# Patient Record
Sex: Male | Born: 1970 | Race: White | Hispanic: No | Marital: Single | Smoking: Never smoker
Health system: Southern US, Community
[De-identification: ages and names within clinical notes are randomized; demographics above are authoritative.]

## PROBLEM LIST (undated history)

## (undated) DIAGNOSIS — E119 Type 2 diabetes mellitus without complications: Secondary | ICD-10-CM

## (undated) DIAGNOSIS — E1165 Type 2 diabetes mellitus with hyperglycemia: Principal | ICD-10-CM

## (undated) DIAGNOSIS — I2699 Other pulmonary embolism without acute cor pulmonale: Secondary | ICD-10-CM

## (undated) HISTORY — DX: Other pulmonary embolism without acute cor pulmonale: I26.99

## (undated) HISTORY — DX: Type 2 diabetes mellitus without complications: E11.9

## (undated) HISTORY — DX: Type 2 diabetes mellitus with hyperglycemia: E11.65

---

## 2004-10-01 ENCOUNTER — Emergency Department: Payer: Self-pay | Admitting: Unknown Physician Specialty

## 2006-08-06 DIAGNOSIS — IMO0001 Reserved for inherently not codable concepts without codable children: Secondary | ICD-10-CM

## 2006-08-06 HISTORY — DX: Reserved for inherently not codable concepts without codable children: IMO0001

## 2011-06-09 ENCOUNTER — Inpatient Hospital Stay: Payer: Self-pay | Admitting: Psychiatry

## 2011-08-07 HISTORY — PX: COLONOSCOPY WITH ESOPHAGOGASTRODUODENOSCOPY (EGD): SHX5779

## 2012-03-25 ENCOUNTER — Ambulatory Visit: Payer: Self-pay | Admitting: Otolaryngology

## 2012-06-02 ENCOUNTER — Ambulatory Visit: Payer: Self-pay | Admitting: Unknown Physician Specialty

## 2013-04-07 ENCOUNTER — Ambulatory Visit (INDEPENDENT_AMBULATORY_CARE_PROVIDER_SITE_OTHER): Payer: BC Managed Care – PPO | Admitting: Family Medicine

## 2013-04-07 ENCOUNTER — Encounter: Payer: Self-pay | Admitting: Family Medicine

## 2013-04-07 VITALS — BP 110/70 | HR 80 | Temp 98.6°F | Ht 72.0 in | Wt 241.0 lb

## 2013-04-07 DIAGNOSIS — IMO0001 Reserved for inherently not codable concepts without codable children: Secondary | ICD-10-CM

## 2013-04-07 DIAGNOSIS — E1140 Type 2 diabetes mellitus with diabetic neuropathy, unspecified: Secondary | ICD-10-CM | POA: Insufficient documentation

## 2013-04-07 DIAGNOSIS — Z23 Encounter for immunization: Secondary | ICD-10-CM

## 2013-04-07 LAB — MICROALBUMIN / CREATININE URINE RATIO: Microalb Creat Ratio: 0.9 mg/g (ref 0.0–30.0)

## 2013-04-07 LAB — BASIC METABOLIC PANEL
CO2: 27 mEq/L (ref 19–32)
Calcium: 9.2 mg/dL (ref 8.4–10.5)
Chloride: 96 mEq/L (ref 96–112)
Creatinine, Ser: 0.8 mg/dL (ref 0.4–1.5)
Glucose, Bld: 276 mg/dL — ABNORMAL HIGH (ref 70–99)

## 2013-04-07 MED ORDER — METFORMIN HCL 1000 MG PO TABS: 1000.0000 mg | ORAL_TABLET | Freq: Two times a day (BID) | ORAL | Status: DC

## 2013-04-07 NOTE — Assessment & Plan Note (Signed)
Foot exam today. Recommended schedule eye exam. Restart metformin , slow taper to try and minimize GI upset. rtc 3 mo for f/u Blood work today.

## 2013-04-07 NOTE — Progress Notes (Signed)
Subjective:    Patient ID: Paul Keller, male    DOB: [birth year], 42 y.o.   MRN: [id number]  HPI CC: New pt to establish  Prior saw Dr. Lahoma Rocker PCP, wanted a change  DM - off meds for last 6 months.  Prior on metformin 3000mg  daily, 1500mg  bid.  Has not been checking sugars recently.  Last blood work was late 2013.  Has glucose meter at home, unsure brand.  Has not been checking sugars recently.  Off meds for last 6 months.  Last eye exam was mid 2013. Foot exam - none recently.  recently had EGD - Dr. Markham Jordan - scar removed, had colonoscopy for fmhx colon cancer, told normal.  Done 2013.  Caffeine: occasional Lives with GF and step daughter, 3 dogs, 1 cat divorced Occupation: road Games developer  Edu: College Activity: work, occasionally goes to gym Diet: good water, fruits/vegetables daily, avoids fried foods and starches  Preventative: DOT physical in January. Pneumovax 2013 Tetanus unsure Flu - declines  Medications and allergies reviewed and updated in chart.  Past histories reviewed and updated if relevant as below. There are no active problems to display for this patient.  Past Medical History  Diagnosis Date  . Type 2 diabetes mellitus 2008    completed DSME   Past Surgical History  Procedure Laterality Date  . Colonoscopy with esophagogastroduodenoscopy (egd)  2013    normal-found scar tissue and removed (Dr. Markham Jordan)   History  Substance Use Topics  . Smoking status: Never Smoker   . Smokeless tobacco: Never Used  . Alcohol Use: No   Family History  Problem Relation Age of Onset  . Cancer      unsure details - mother adopted, no contact with father  . Diabetes Mother   . Diabetes Father   . Hypertension Mother   . Hypertension Father   . CAD Brother     MI  . Heart failure Mother   . Heart failure Father   . Stroke Neg Hx    Allergies  Allergen Reactions  . Penicillins Rash   No current outpatient prescriptions on file prior to  visit.   No current facility-administered medications on file prior to visit.     Review of Systems  Constitutional: Negative for fever, chills, activity change, appetite change, fatigue and unexpected weight change.  HENT: Negative for hearing loss and neck pain.   Eyes: Negative for visual disturbance.  Respiratory: Negative for cough, chest tightness, shortness of breath and wheezing.   Cardiovascular: Negative for chest pain, palpitations and leg swelling.  Gastrointestinal: Negative for nausea, vomiting, abdominal pain, diarrhea, constipation, blood in stool and abdominal distention.  Genitourinary: Negative for hematuria and difficulty urinating.  Musculoskeletal: Negative for myalgias and arthralgias.  Skin: Negative for rash.  Neurological: Negative for dizziness, seizures, syncope and headaches.  Hematological: Negative for adenopathy. Does not bruise/bleed easily.  Psychiatric/Behavioral: Negative for dysphoric mood. The patient is not nervous/anxious.        Objective:   Physical Exam  Nursing note and vitals reviewed. Constitutional: He is oriented to person, place, and time. He appears well-developed and well-nourished. No distress.  HENT:  Head: Normocephalic and atraumatic.  Right Ear: Hearing, tympanic membrane, external ear and ear canal normal.  Left Ear: Hearing, tympanic membrane, external ear and ear canal normal.  Nose: Nose normal.  Mouth/Throat: Oropharynx is clear and moist. No oropharyngeal exudate.  Eyes: Conjunctivae and EOM are normal. Pupils are equal, round, and reactive to light. No  scleral icterus.  Neck: Normal range of motion. Neck supple. No thyromegaly present.  Cardiovascular: Normal rate, regular rhythm, normal heart sounds and intact distal pulses.   No murmur heard. Pulses:      Radial pulses are 2+ on the right side, and 2+ on the left side.  Pulmonary/Chest: Effort normal and breath sounds normal. No respiratory distress. He has no  wheezes. He has no rales.  Musculoskeletal: Normal range of motion. He exhibits no edema.  Diabetic foot exam: Normal inspection No skin breakdown No calluses  Normal DP/PT pulses Normal sensation to light touch and monofilament Nails normal  Lymphadenopathy:    He has no cervical adenopathy.  Neurological: He is alert and oriented to person, place, and time.  CN grossly intact, station and gait intact  Skin: Skin is warm and dry. No rash noted.  Psychiatric: He has a normal mood and affect. His behavior is normal. Judgment and thought content normal.      Assessment & Plan:  Tdap today.

## 2013-04-07 NOTE — Patient Instructions (Addendum)
Blood work today. Restart metformin.  Take 500mg  nightly for 4 days then increase to 500mg  twice daily for 1-2 week then increase to 1000mg  twice daily. Let me know brand of glucose meter to send in strips. Return in 2-3 months for recheck sugars. Tdap today (tetanus /pertussis)

## 2013-04-07 NOTE — Addendum Note (Signed)
Addended by: Josph Macho A on: 04/07/2013 10:52 AM   Modules accepted: Orders

## 2013-06-03 ENCOUNTER — Telehealth: Payer: Self-pay

## 2013-06-03 NOTE — Telephone Encounter (Signed)
Patient notified and appt scheduled.

## 2013-06-03 NOTE — Telephone Encounter (Signed)
Pt said he slowly tapered metformin and is now taking 1000 mg twice a day. About 4 hours after taking Metformin pt has diarrhea. No N or V. Pt has previously discussed with Dr Sharen Hones about taking Cialis once a day. Pt request written rx for Cialis and if Dr Sharen Hones changes diabetic med pt request written rx because pt is going to use mail order pharmacy due to ins guidelines and pt does not know name of mail order pharmacy yet.pt request cb.

## 2013-06-03 NOTE — Telephone Encounter (Signed)
I would give metformin 1000mg  bid at least 1-2 wks as it can take a while for GI side effects to subside with titration. No records from prior PCP Recommend office visit to discuss sugars as well as cialis/ED.

## 2013-06-05 ENCOUNTER — Ambulatory Visit: Payer: BC Managed Care – PPO | Admitting: Family Medicine

## 2013-06-11 ENCOUNTER — Other Ambulatory Visit: Payer: Self-pay

## 2013-07-07 ENCOUNTER — Ambulatory Visit: Payer: BC Managed Care – PPO | Admitting: Family Medicine

## 2013-07-08 ENCOUNTER — Ambulatory Visit: Payer: BC Managed Care – PPO | Admitting: Family Medicine

## 2014-02-15 ENCOUNTER — Telehealth: Payer: Self-pay | Admitting: *Deleted

## 2014-02-15 NOTE — Telephone Encounter (Signed)
Lm on pts vm requesting a call back to schedule DIABETIC BUNDLE OV-needs LDL and A1C

## 2014-06-30 ENCOUNTER — Other Ambulatory Visit: Payer: Self-pay | Admitting: Family Medicine

## 2014-07-05 NOTE — Telephone Encounter (Signed)
Pt left v/m requesting refill metformin to [facility]; sent to pharmacy on 07/02/14; spoke with [facility] and rx ready for pick up. Left detailed v/m that metformin ready for pick up at [facility] and to keep appt on 07/06/14.

## 2014-07-06 ENCOUNTER — Other Ambulatory Visit: Payer: Self-pay

## 2014-07-06 ENCOUNTER — Encounter: Payer: Self-pay | Admitting: Family Medicine

## 2014-07-06 ENCOUNTER — Ambulatory Visit (INDEPENDENT_AMBULATORY_CARE_PROVIDER_SITE_OTHER): Payer: BC Managed Care – PPO | Admitting: Family Medicine

## 2014-07-06 VITALS — BP 124/76 | HR 104 | Temp 98.2°F | Ht 72.0 in | Wt 246.2 lb

## 2014-07-06 DIAGNOSIS — E1140 Type 2 diabetes mellitus with diabetic neuropathy, unspecified: Secondary | ICD-10-CM

## 2014-07-06 DIAGNOSIS — E1165 Type 2 diabetes mellitus with hyperglycemia: Secondary | ICD-10-CM

## 2014-07-06 DIAGNOSIS — N529 Male erectile dysfunction, unspecified: Secondary | ICD-10-CM

## 2014-07-06 DIAGNOSIS — Z8249 Family history of ischemic heart disease and other diseases of the circulatory system: Secondary | ICD-10-CM | POA: Insufficient documentation

## 2014-07-06 DIAGNOSIS — IMO0002 Reserved for concepts with insufficient information to code with codable children: Secondary | ICD-10-CM

## 2014-07-06 LAB — MICROALBUMIN / CREATININE URINE RATIO
CREATININE, U: 69.3 mg/dL
Microalb Creat Ratio: 1.6 mg/g (ref 0.0–30.0)
Microalb, Ur: 1.1 mg/dL (ref 0.0–1.9)

## 2014-07-06 LAB — HEMOGLOBIN A1C: Hgb A1c MFr Bld: 10.2 % — ABNORMAL HIGH (ref 4.6–6.5)

## 2014-07-06 MED ORDER — ONETOUCH ULTRA SYSTEM W/DEVICE KIT: 1.0000 | PACK | Freq: Once | Status: AC

## 2014-07-06 MED ORDER — METFORMIN HCL ER (MOD) 1000 MG PO TB24: 1000.0000 mg | ORAL_TABLET | Freq: Every day | ORAL | Status: DC

## 2014-07-06 MED ORDER — ONETOUCH ULTRASOFT LANCETS MISC: Status: AC

## 2014-07-06 MED ORDER — GLIMEPIRIDE 1 MG PO TABS: 1.0000 mg | ORAL_TABLET | Freq: Every day | ORAL | Status: DC

## 2014-07-06 MED ORDER — GLUCOSE BLOOD VI STRP: ORAL_STRIP | Status: DC

## 2014-07-06 NOTE — Progress Notes (Signed)
Pre visit review using our clinic review tool, if applicable. No additional management support is needed unless otherwise documented below in the visit note. 

## 2014-07-06 NOTE — Assessment & Plan Note (Addendum)
Not seen since 04/2013.  Foot exam today. rec schedule eye exam as due.  Pt endorses metformin causing GI upset - will change to ER form and add glimepiride.  Blood work today.  RTC 3 mo f/u.  Discussed importance of better sugar control to avoid long term consequences.

## 2014-07-06 NOTE — Telephone Encounter (Signed)
Sent in

## 2014-07-06 NOTE — Assessment & Plan Note (Addendum)
Strong fmhx CAD, but pt asxs. Discussed reasonable to refer for stress test Pt leaving next month back to [place] for several weeks - will look for local cardiology office there and call us for referral, or may just request local referral when returns to area.

## 2014-07-06 NOTE — Progress Notes (Addendum)
BP 124/76 mmHg  Pulse 104  Temp(Src) 98.2 F (36.8 C) (Oral)  Ht 6' (1.829 m)  Wt 246 lb 4 oz (111.698 kg)  BMI 33.39 kg/m2   CC: DM f/u visit  Subjective:    Patient ID: Paul Keller, male    DOB: 12-Jun-1971, 43 y.o.   MRN: [id number]  HPI: Paul Keller is a 43 y.o. male presenting on 07/06/2014 for Follow-up and Medication Refill   Not seen here since 04/2013, did not f/u as advised. Actually has been travelling over last year - significant portion of time spent in [place]. Family lives here. Goal is to relocate here permanently.  DM - does check sugars about once or twice a week - running 180-200s.  Compliant with antihyperglycemic regimen which includes: metformin 1000mg .  Actually takes 3000mg  once daily. Denies low sugars or hypoglycemic symptoms.  + paresthesia of hand sand feets. Last diabetic eye exam DUE.  Pneumovax: 08/2011.  Prevnar: not due. Avoiding sweets, watching carbs.  Has undergone DMSE when diagnosed ~2009.   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   No regular exercise. Strong fmhx CAD. To go back to [place] later next week.  Lab Results  Component Value Date   HGBA1C 10.2* 07/06/2014    Relevant past medical, surgical, family and social history reviewed and updated as indicated. Interim medical history since our last visit reviewed. Allergies and medications reviewed and updated.  No current outpatient prescriptions on file prior to visit.   No current facility-administered medications on file prior to visit.    Review of Systems Per HPI unless specifically indicated above     Objective:    BP 124/76 mmHg  Pulse 104  Temp(Src) 98.2 F (36.8 C) (Oral)  Ht 6' (1.829 m)  Wt 246 lb 4 oz (111.698 kg)  BMI 33.39 kg/m2  Wt Readings from Last 3 Encounters:  07/06/14 246 lb 4 oz (111.698 kg)  04/07/13 241 lb (109.317 kg)    Physical Exam  Constitutional: He appears  well-developed and well-nourished. No distress.  HENT:  Head: Normocephalic and atraumatic.  Right Ear: External ear normal.  Left Ear: External ear normal.  Nose: Nose normal.  Mouth/Throat: Oropharynx is clear and moist. No oropharyngeal exudate.  Eyes: Conjunctivae and EOM are normal. Pupils are equal, round, and reactive to light. No scleral icterus.  Neck: Normal range of motion. Neck supple.  Cardiovascular: Normal rate, regular rhythm, normal heart sounds and intact distal pulses.   No murmur heard. Pulmonary/Chest: Effort normal and breath sounds normal. No respiratory distress. He has no wheezes. He has no rales.  Musculoskeletal: He exhibits no edema.  Diabetic foot exam: Normal inspection No skin breakdown Calluses bilateral great toes Normal DP/PT pulses Normal sensation to light touch and monofilament Nails normal  Lymphadenopathy:    He has no cervical adenopathy.  Skin: Skin is warm and dry. No rash noted.  Psychiatric: He has a normal mood and affect.  Nursing note and vitals reviewed.  Results for orders placed or performed in visit on 07/06/14  LDL Cholesterol, Direct  Result Value Ref Range   Direct LDL 106.0 mg/dL  Hemoglobin [id number]  Result Value Ref Range   Hgb A1c MFr Bld 10.2 (H) 4.6 - 6.5 %  Microalbumin / creatinine urine ratio  Result Value Ref Range   Microalb, Ur 1.1 0.0 - 1.9 mg/dL   Creatinine,U 81.169.3 mg/dL   Microalb Creat Ratio 1.6  0.0 - 30.0 mg/g  Basic metabolic panel  Result Value Ref Range   Sodium 135 135 - 145 mEq/L   Potassium 4.8 3.5 - 5.1 mEq/L   Chloride 100 96 - 112 mEq/L   CO2 26 19 - 32 mEq/L   Glucose, Bld 256 (H) 70 - 99 mg/dL   BUN 17 6 - 23 mg/dL   Creatinine, Ser 0.9 0.4 - 1.5 mg/dL   Calcium 9.8 8.4 - 57.810.5 mg/dL   GFR 46.9695.77 >29.52>60.00 mL/min      Assessment & Plan:   Problem List Items Addressed This Visit    Type 2 diabetes mellitus, uncontrolled, with neuropathy - Primary    Not seen since 04/2013.  Foot exam today.  rec schedule eye exam as due.  Pt endorses metformin causing GI upset - will change to ER form and add glimepiride.  Blood work today.  RTC 3 mo f/u.  Discussed importance of better sugar control to avoid long term consequences.    Relevant Medications      metFORMIN (GLUMETZA) 24 hr tablet   Other Relevant Orders      LDL Cholesterol, Direct (Completed)      Hemoglobin A1c (Completed)      Microalbumin / creatinine urine ratio (Completed)      Basic metabolic panel (Completed)   Family history of early CAD    Strong fmhx CAD, but pt asxs. Discussed reasonable to refer for stress test Pt leaving next month back to [place] for several weeks - will look for local cardiology office there and call us for referral, or may just request local referral when returns to area.    Erectile dysfunction       Follow up plan: Return in about 3 months (around 10/05/2014), or as needed, for follow up visit.

## 2014-07-06 NOTE — Patient Instructions (Addendum)
Schedule eye exam as you're due. Blood work today -  Let's try metformin extended release - 1000mg  XR daily - either in am or at night time. Also start amaryl 1mg  in the morning with breakfast - make sure to eat with this medicine. Update me with effect of metformin extended release after 2 weeks - and we may try to increase dose to 2000mg  once daily. Call us if you find a cardiologist you'd like to see in [place]. Return in 3-4 months for follow up, sooner if needed.

## 2014-07-06 NOTE — Telephone Encounter (Signed)
Christiane HaShandra from [facility] left v/m; for insurance coverage need how many times a day pt checks BS and diagnosis ICD10 also included on rx. Please advise.

## 2014-07-07 ENCOUNTER — Telehealth: Payer: Self-pay | Admitting: *Deleted

## 2014-07-07 LAB — BASIC METABOLIC PANEL
BUN: 17 mg/dL (ref 6–23)
CHLORIDE: 100 meq/L (ref 96–112)
CO2: 26 meq/L (ref 19–32)
Calcium: 9.8 mg/dL (ref 8.4–10.5)
Creatinine, Ser: 0.9 mg/dL (ref 0.4–1.5)
GFR: 95.77 mL/min (ref >60.00)
Glucose, Bld: 256 mg/dL — ABNORMAL HIGH (ref 70–99)
POTASSIUM: 4.8 meq/L (ref 3.5–5.1)
Sodium: 135 mEq/L (ref 135–145)

## 2014-07-07 LAB — LDL CHOLESTEROL, DIRECT: Direct LDL: 106 mg/dL

## 2014-07-07 NOTE — Telephone Encounter (Signed)
[facility] in your [facility] box for completion.

## 2014-07-08 NOTE — Telephone Encounter (Signed)
Form faxed. Will await determination. 

## 2014-07-08 NOTE — Telephone Encounter (Signed)
Filled and in Kim's box. 

## 2014-07-27 ENCOUNTER — Telehealth: Payer: BC Managed Care – PPO | Admitting: Nurse Practitioner

## 2014-07-27 DIAGNOSIS — M545 Low back pain: Secondary | ICD-10-CM

## 2014-07-27 NOTE — Progress Notes (Signed)
We are sorry that you are not feeling well.  Here is how we plan to help!  Based on what you have shared with me it looks like you will need a face to face visit for complicated symptoms that could represent a serious problem. I can ot give any pain meds by e visit- sounds like may be a kidney stone and you will need to go to ER or see your PCP in the morning. Sorry I could not do more to help you.  If you are having a true medical emergency please call 911.  If you need an urgent face to face visit, [place] has four urgent care centers for your convenience.  Tressie Ellis. [facility]  [phone] Get Driving Directions Find a Provider at this Location  [address] [place], [place] [address] . 8 am to 8 pm Monday-Friday . 9 am to 7 pm Saturday-Sunday  . [facility]  [phone] Get Driving Directions Find a Provider at this Location  [address] [place], [place] [address] . 8 am to 8 pm Monday-Friday . 9 am to 6 pm Saturday . 11 am to 6 pm Sunday   . [facility]  [phone] Get Driving Directions  [address] [place], [place] [address] . 8 am to 8 pm Monday-Friday . 9 am to 4 pm Saturday-Sunday   . [facility] (a walk in primary care provider)  [phone]  Get Driving Directions Find a Provider at this Location  [address] [place], [place] [address] . 8 am to 8:30 pm Monday-Thursday . 8 am to 6 pm Friday . 8 am to 4 pm Saturday-Sunday  Your e-visit answers were reviewed by a board certified advanced clinical practitioner to complete your personal care plan.  Depending on the condition, your plan could have included both over the counter or prescription medications.  You will get an e-mail in the next two days asking about your experience.  I hope that your e-visit has been valuable and will speed your recovery . Thank you for choosing  an e-visit.

## 2016-11-08 DIAGNOSIS — I2699 Other pulmonary embolism without acute cor pulmonale: Secondary | ICD-10-CM

## 2016-11-08 HISTORY — DX: Other pulmonary embolism without acute cor pulmonale: I26.99

## 2016-11-16 ENCOUNTER — Telehealth: Payer: Self-pay

## 2016-11-16 NOTE — Telephone Encounter (Signed)
Rerouted to pulmonology

## 2016-11-16 NOTE — Telephone Encounter (Signed)
Patient unable to obtain cd of images.  Schedule with patient for 4/16 kasa at 345 pm patient wanted to know if he could be seen sooner.

## 2016-11-16 NOTE — Telephone Encounter (Signed)
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  Would like to know when he can come in to see [place]  Please advise

## 2016-11-16 NOTE — Telephone Encounter (Signed)
Pt calling back stating hospital will not release the info to [place], they would mail it to [place] But is wanting to make sure we will still be able to see him.

## 2016-11-16 NOTE — Telephone Encounter (Signed)
Schedule pt to come in Monday with DK as a new consult and pt needs to bring any scans on a CD with him. I have blocked a 3:45pm slot that you can open. Thanks

## 2016-11-16 NOTE — Telephone Encounter (Signed)
Spoke with patient-he is aware to keep appt and let Misty and Dr. Belia Heman know that the hospital will mail disc to the [place] office. Pt can sign medical release to at least get written report sent STAT to Dr. Belia Heman. Pt will be at office at 3:30pm to check in and complete paperwork. Nothing more needed at this time.

## 2016-11-19 ENCOUNTER — Encounter: Payer: Self-pay | Admitting: Internal Medicine

## 2016-11-19 ENCOUNTER — Encounter: Payer: Self-pay | Admitting: *Deleted

## 2016-11-19 ENCOUNTER — Ambulatory Visit (INDEPENDENT_AMBULATORY_CARE_PROVIDER_SITE_OTHER): Payer: 59 | Admitting: Internal Medicine

## 2016-11-19 VITALS — BP 110/76 | HR 94 | Wt 237.0 lb

## 2016-11-19 DIAGNOSIS — I2692 Saddle embolus of pulmonary artery without acute cor pulmonale: Secondary | ICD-10-CM

## 2016-11-19 DIAGNOSIS — D6859 Other primary thrombophilia: Secondary | ICD-10-CM | POA: Diagnosis not present

## 2016-11-19 DIAGNOSIS — R42 Dizziness and giddiness: Secondary | ICD-10-CM | POA: Diagnosis not present

## 2016-11-19 NOTE — Progress Notes (Signed)
Paul Keller Medicine Consultation      Date: 11/19/2016,   MRN# [id number] Paul Keller 07/29/1971 Code Status:  Code Status History    This patient does not have a recorded code status. Please follow your organizational policy for patients in this situation.     Hosp day:_0 @ Referring MD: _1 @     PCP:      AdmissionWeight: 237 lb (107.5 kg)                 CurrentWeight: 237 lb (107.5 kg) [name] is a 46 y.o. old male seen in consultation for PE at the request of Dr. Christean Grief.     CHIEF COMPLAINT:  SOB and dizziness  SOB and   HISTORY OF PRESENT ILLNESS  46 yo white male seen today for Dx of Saddle PE and DVT  Last week was admitted to hospital for acute hypoxia and dizziness with LOC while at work sighned [facility], then [facility] lifted to [facility] for further work up and DX  Patient was dx with PE and was given TPA, placed on heparin and d/c on Elliquis Patient also has DVT in left leg  Patient now with some dizsinees with exertion, no SOB, no cough No signs of infection  Patient travelled to [place] 2 months ago, 5 days drive, 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      PAST MEDICAL HISTORY   Past Medical History:  Diagnosis Date  . Diabetes ([place])   . Type II or unspecified type diabetes mellitus without mention of complication, uncontrolled 2008   completed DSME     SURGICAL HISTORY   Past Surgical History:  Procedure Laterality Date  . COLONOSCOPY WITH ESOPHAGOGASTRODUODENOSCOPY (EGD)  2013   normal-found scar tissue and removed (Dr. [name])     FAMILY HISTORY   Family History  Problem Relation Age of Onset  . Cancer      unsure details - mother adopted, no contact with father  . Diabetes Mother   . Hypertension Mother   . Heart failure Mother     CHF  . Diabetes Father   . Hypertension Father   . CAD Father     MI    . CAD Brother 41    MI x2  . Stroke Neg Hx      SOCIAL HISTORY   Social History  Substance Use Topics  . Smoking status: Never Smoker  . Smokeless tobacco: Never Used  . Alcohol use No     MEDICATIONS    Home Medication:  Current Outpatient Rx  . Order #: [id number] Class: Historical Med  . Order #: [id number] Class: Normal  . Order #: [id number] Class: Historical Med  . Order #: [id number] Class: Normal  . Order #: [id number] Class: Normal  . Order #: [id number] Class: Normal    Current Medication:  Current Outpatient Prescriptions:  .  apixaban (ELIQUIS) 5 MG TABS tablet, Take 5 mg by mouth 2 (two) times daily., Disp: , Rfl:  .  Blood Glucose Monitoring Suppl (ONE TOUCH ULTRA SYSTEM KIT) W/DEVICE KIT, 1 kit by Does not apply route once., Disp: 1 each, Rfl: 0 .  glimepiride (AMARYL) 4 MG tablet, Take 4 mg by mouth daily with breakfast., Disp: , Rfl:  .  glucose blood test strip, Use as instructed to check sugar once daily or as needed. Dx: E11.40, Disp: 100 each, Rfl: 3 .  Lancets [facility]  ULTRASOFT) lancets, Use as instructed to check sugar once daily or as needed. Dx: E11.40, Disp: 100 each, Rfl: 3 .  metFORMIN (GLUMETZA) 1000 MG (MOD) 24 hr tablet, Take 1 tablet (1,000 mg total) by mouth daily with breakfast., Disp: 90 tablet, Rfl: 3    ALLERGIES   Penicillins     REVIEW OF SYSTEMS   Review of Systems  Constitutional: Negative for chills, diaphoresis, fever, malaise/fatigue and weight loss.  HENT: Negative for congestion and hearing loss.   Eyes: Negative for blurred vision and double vision.  Respiratory: Positive for shortness of breath. Negative for cough, hemoptysis, sputum production and wheezing.   Cardiovascular: Negative for chest pain, palpitations and orthopnea.  Gastrointestinal: Negative for abdominal pain, heartburn, nausea and vomiting.  Genitourinary: Negative for dysuria and urgency.  Musculoskeletal: Negative for back pain, myalgias and neck pain.   Skin: Negative for rash.  Neurological: Positive for dizziness and headaches. Negative for tingling, tremors and weakness.  Endo/Heme/Allergies: Does not bruise/bleed easily.  Psychiatric/Behavioral: Negative for depression, substance abuse and suicidal ideas.  All other systems reviewed and are negative.    VS: BP 110/76 (BP Location: Left Arm, Cuff Size: Normal)   Pulse 94   Wt 237 lb (107.5 kg)   SpO2 99%   BMI 32.14 kg/m      PHYSICAL EXAM  Physical Exam  Constitutional: He is oriented to person, place, and time. He appears well-developed and well-nourished. No distress.  HENT:  Head: Normocephalic and atraumatic.  Mouth/Throat: No oropharyngeal exudate.  Eyes: EOM are normal. Pupils are equal, round, and reactive to light. No scleral icterus.  Neck: Normal range of motion. Neck supple.  Cardiovascular: Normal rate, regular rhythm and normal heart sounds.   No murmur heard. Keller/Chest: No stridor. No respiratory distress. He has no wheezes.  Abdominal: Soft. Bowel sounds are normal.  Musculoskeletal: Normal range of motion. He exhibits no edema.  Neurological: He is alert and oriented to person, place, and time. No cranial nerve deficit.  Skin: Skin is warm. He is not diaphoretic.  Psychiatric: He has a normal mood and affect.         ASSESSMENT/PLAN   46 yo white male with  Recent PE/DVT with headaches and dizziness with exertion SOB  1.check ONO and 6MWT 2.follow up with Hem oNc for hypercoaguable work up [employer]    Patient satisfied with Plan of action and management. All questions answered  Corrin Parker, M.D.  [facility]  Medical Director [place] Director [facility] Cardio-[facility] Department

## 2016-11-19 NOTE — Patient Instructions (Signed)
Check ONO  Check Referral to Hem ONC for Hypercoagulable work up

## 2016-11-21 ENCOUNTER — Encounter: Payer: Self-pay | Admitting: Internal Medicine

## 2016-11-21 DIAGNOSIS — R42 Dizziness and giddiness: Secondary | ICD-10-CM

## 2016-11-21 DIAGNOSIS — I2692 Saddle embolus of pulmonary artery without acute cor pulmonale: Secondary | ICD-10-CM

## 2016-11-23 ENCOUNTER — Ambulatory Visit (INDEPENDENT_AMBULATORY_CARE_PROVIDER_SITE_OTHER): Payer: 59 | Admitting: *Deleted

## 2016-11-23 DIAGNOSIS — R0600 Dyspnea, unspecified: Secondary | ICD-10-CM

## 2016-11-23 DIAGNOSIS — I2692 Saddle embolus of pulmonary artery without acute cor pulmonale: Secondary | ICD-10-CM | POA: Diagnosis not present

## 2016-11-23 NOTE — Progress Notes (Signed)
SMW performed today. 

## 2016-11-27 ENCOUNTER — Inpatient Hospital Stay: Payer: 59 | Attending: Oncology | Admitting: Oncology

## 2016-11-27 ENCOUNTER — Encounter: Payer: Self-pay | Admitting: Oncology

## 2016-11-27 VITALS — BP 115/77 | HR 89 | Temp 97.8°F | Resp 18 | Ht 73.62 in | Wt 237.0 lb

## 2016-11-27 DIAGNOSIS — Z7984 Long term (current) use of oral hypoglycemic drugs: Secondary | ICD-10-CM | POA: Diagnosis not present

## 2016-11-27 DIAGNOSIS — I82412 Acute embolism and thrombosis of left femoral vein: Secondary | ICD-10-CM | POA: Diagnosis not present

## 2016-11-27 DIAGNOSIS — J9811 Atelectasis: Secondary | ICD-10-CM | POA: Insufficient documentation

## 2016-11-27 DIAGNOSIS — Z79899 Other long term (current) drug therapy: Secondary | ICD-10-CM | POA: Diagnosis not present

## 2016-11-27 DIAGNOSIS — I517 Cardiomegaly: Secondary | ICD-10-CM | POA: Diagnosis not present

## 2016-11-27 DIAGNOSIS — E1140 Type 2 diabetes mellitus with diabetic neuropathy, unspecified: Secondary | ICD-10-CM | POA: Diagnosis not present

## 2016-11-27 DIAGNOSIS — I2692 Saddle embolus of pulmonary artery without acute cor pulmonale: Secondary | ICD-10-CM | POA: Diagnosis not present

## 2016-11-27 DIAGNOSIS — R51 Headache: Secondary | ICD-10-CM | POA: Diagnosis not present

## 2016-11-27 DIAGNOSIS — I824Y1 Acute embolism and thrombosis of unspecified deep veins of right proximal lower extremity: Secondary | ICD-10-CM

## 2016-11-27 DIAGNOSIS — I2602 Saddle embolus of pulmonary artery with acute cor pulmonale: Secondary | ICD-10-CM

## 2016-11-27 DIAGNOSIS — R0602 Shortness of breath: Secondary | ICD-10-CM | POA: Insufficient documentation

## 2016-11-27 DIAGNOSIS — N529 Male erectile dysfunction, unspecified: Secondary | ICD-10-CM | POA: Insufficient documentation

## 2016-11-27 NOTE — Progress Notes (Signed)
Hematology/Oncology Consult note [facility] [phone] [phone]  Patient Care Team: No Pcp Per Patient as PCP - General (General Practice)   Name of the patient: Paul Keller  [id number]  [birth year]    Reason for referral- DVT and saddle PE   Referring physician- Dr. Mortimer Fries  Date of visit: 11/27/16   History of presenting illness- 1. Patient is a 46 year old male with known past medical history other than diabetes. No family or personal history of blood clots. On 11/14/2016 lesion passed out at his work times twice and was taken to the ER for further evaluation. After he recovered from his loss of consciousness and fell short of breath and began to hyperventilate.  2. Ultrasound of the lower extremity on 11/14/2016 showed:Acute to subacute appearing deep venous thrombosis within the left lower extremity. The thrombus is large in size and likely acute to subacute and is nearly occlusive. There is evidence of deep venous thrombosis within the left superficial femoral vein and left popliteal vein  3. CTA of the chest on 11/14/2016 showed: IMPRESSION: 1. Extensive bilateral pulmonary emboli with a saddle embolus across the bifurcation of the main pulmonary artery. Thrombus extends into the a sending and ascending branches of the bilateral pulmonary arteries, in the  lower lobe branches predominantly. 2. Mild cardiomegaly. 3. Mild dependent atelectasis in the lower lobes of both lungs.  4. Echocardiogram showed:Conclusion 1. The LV ejection fraction is normal. 2. Systolic and diastolic septal flattening consistent with RV volume and pressure overload. 3.The right ventricle is moderately dilated. 4. The right ventricular systolic function is normal. No McConnell's sign. 5. There is mild to moderate tricuspid regurgitation. 6. Right ventricular systolic pressure is elevated at 40-45 mm Hg.  5. Patient had traveled to you to about 2-3 months  prior to this episode but no recent travel prior to his diagnosis. No recent surgeries. He overall felt he feels well overall and denies any unintentional weight loss. Denies any blood in stools. He is going to significant stress because of his wife's cancer. No family history of colon cancer. He is a lifetime nonsmoker. Reports occasional headaches. Su feels fatigued and somewhat winded. He has not required any oxygen  ECOG PS- 1  Pain scale- 0   Review of systems- Review of Systems  Constitutional: Negative for chills, fever, malaise/fatigue and weight loss.  HENT: Negative for congestion, ear discharge and nosebleeds.   Eyes: Negative for blurred vision.  Respiratory: Negative for cough, hemoptysis, sputum production, shortness of breath and wheezing.   Cardiovascular: Negative for chest pain, palpitations, orthopnea and claudication.  Gastrointestinal: Negative for abdominal pain, blood in stool, constipation, diarrhea, heartburn, melena, nausea and vomiting.  Genitourinary: Negative for dysuria, flank pain, frequency, hematuria and urgency.  Musculoskeletal: Negative for back pain, joint pain and myalgias.  Skin: Negative for rash.  Neurological: Negative for dizziness, tingling, focal weakness, seizures, weakness and headaches.  Endo/Heme/Allergies: Does not bruise/bleed easily.  Psychiatric/Behavioral: Negative for depression and suicidal ideas. The patient does not have insomnia.     Allergies  Allergen Reactions  . Penicillins Rash    Patient Active Problem List   Diagnosis Date Noted  . Erectile dysfunction 07/06/2014  . Family history of early CAD 07/06/2014  . Type 2 diabetes mellitus, uncontrolled, with neuropathy (HCC)      Past Medical History:  Diagnosis Date  . Diabetes ([place])   . Pulmonary emboli ([place]) 11/08/2016   2 clots in lungs and left leg per pt  .  Type II or unspecified type diabetes mellitus without mention of complication, uncontrolled 2008    completed DSME     Past Surgical History:  Procedure Laterality Date  . COLONOSCOPY WITH ESOPHAGOGASTRODUODENOSCOPY (EGD)  2013   normal-found scar tissue and removed (Dr. [name])    Social History   Social History  . Marital status: Single    Spouse name: N/A  . Number of children: N/A  . Years of education: N/A   Occupational History  . Not on file.   Social History Main Topics  . Smoking status: Never Smoker  . Smokeless tobacco: Never Used  . Alcohol use No  . Drug use: No  . Sexual activity: Yes   Other Topics Concern  . Not on file   Social History Narrative   Caffeine: occasional   Lives with GF and step daughter, 3 dogs, 1 cat   divorced   Occupation: road Futures trader    Edu: Secretary/administrator   Activity: work   Diet: good water, fruits/vegetables daily, avoids fried foods and starches     Family History  Problem Relation Age of Onset  . Cancer      unsure details - mother adopted, no contact with father  . Diabetes Mother   . Hypertension Mother   . Heart failure Mother     CHF  . Diabetes Father   . Hypertension Father   . CAD Father     MI  . CAD Brother 40    MI x2  . Stroke Neg Hx      Current Outpatient Prescriptions:  .  acetaminophen (TYLENOL) 325 MG tablet, Take 650 mg by mouth every 6 (six) hours as needed., Disp: , Rfl:  .  apixaban (ELIQUIS) 5 MG TABS tablet, Take 5 mg by mouth 2 (two) times daily., Disp: , Rfl:  .  Blood Glucose Monitoring Suppl (ONE TOUCH ULTRA SYSTEM KIT) W/DEVICE KIT, 1 kit by Does not apply route once., Disp: 1 each, Rfl: 0 .  glimepiride (AMARYL) 4 MG tablet, Take 4 mg by mouth daily with breakfast., Disp: , Rfl:  .  glucose blood test strip, Use as instructed to check sugar once daily or as needed. Dx: E11.40, Disp: 100 each, Rfl: 3 .  Lancets (ONETOUCH ULTRASOFT) lancets, Use as instructed to check sugar once daily or as needed. Dx: E11.40, Disp: 100 each, Rfl: 3 .  metFORMIN (GLUMETZA) 1000 MG (MOD) 24  hr tablet, Take 1 tablet (1,000 mg total) by mouth daily with breakfast. (Patient taking differently: Take 1,000 mg by mouth 2 (two) times daily with a meal. ), Disp: 90 tablet, Rfl: 3   Physical exam:  Vitals:   11/27/16 1132  BP: 115/77  Pulse: 89  Resp: 18  Temp: 97.8 F (36.6 C)  TempSrc: Tympanic  Weight: 237 lb (107.5 kg)  Height: 6' 1.62" (1.87 m)   Physical Exam  Constitutional: He is oriented to person, place, and time and well-developed, well-nourished, and in no distress.  HENT:  Head: Normocephalic and atraumatic.  Eyes: EOM are normal. Pupils are equal, round, and reactive to light.  Neck: Normal range of motion.  Cardiovascular: Normal rate, regular rhythm and normal heart sounds.   Pulmonary/Chest: Effort normal and breath sounds normal.  Abdominal: Soft. Bowel sounds are normal.  Lymphadenopathy:  No palpable cervical, supraclavicular or inguinal adenopathy  Neurological: He is alert and oriented to person, place, and time.  Skin: Skin is warm and dry.  CMP Latest Ref Rng & Units 07/06/2014  Glucose 70 - 99 mg/dL 256(H)  BUN 6 - 23 mg/dL 17  Creatinine 0.4 - 1.5 mg/dL 0.9  Sodium 135 - 145 mEq/L 135  Potassium 3.5 - 5.1 mEq/L 4.8  Chloride 96 - 112 mEq/L 100  CO2 19 - 32 mEq/L 26  Calcium 8.4 - 10.5 mg/dL 9.8    Assessment and plan- Patient is a 46 y.o. male with newly diagnosed unprovoked right proximal and distal extremity DVT and bilateral saddle embolism with right heart strain  No obvious provoking factors prior to the DVT and PE. This is likely an unprovoked event that given that he had both proximal extremity DVT as well as a bilateral saddle PE resulting in right heart strain and male sex, I would recommend that patient should stay on indefinite anticoagulation. The estimated risk of recurrence of another thromboembolic episode was 14% in the first year followed by 5% every year and 30% at 5 years. I did discuss the risks and benefits of  different anticoagulation options. Lovenox would not be a good long term option given that it is a shot. Coumadin has been compared to newer anticoagulants and has been found to have slightly increased risk of bleeding but also needs periodic INR checks. However Coumadin is readily reversible. New anticoagulation options include Xarelto Eliquis and pradaxa. There is an FDA approved reversal agent for pradaxa but Xarelto and Eliquis do not have FDA approved reversal agents. The patient does not have any cardiovascular risk factors and kidney issues and the risk of bleeding in his case is low. Patient understands the pros and cons of continuing versus stopping anticoagulation and is agreeable to continuing anticoagulation at this time. The decision to remain on indefinite anticoagulation would depend upon risk of bleeding versus risk of clotting in the future.  With regards to hypercoagulable workup- it would not necessarily change management in his case because I do think that he will benefit from indefinite anticoagulation. However given his young age and seemingly unprovoked DVT in the absence of family history, as well repurcussion for his children; I would recommend checking for factor V leiden, prothrombin gene mutation and antiphospholipid antibody syndrome work up including Beta 2 glycoprotein, anticardiolipin antibody and DRVVT testing. Other testing such as protein C, protein S and antithrombin III testing cannot be done while on anticoagulation. We could consider it down the line perhaps after a year or so if his anticoagulation can be interrupted for a few weeks  No concerning signs symptoms of malignancy. I will hold off on systemic scans since malignancy workup is not typically recommended for DVT/PE in patients without concerning signs and symptoms  Ideally I would like his PCP to manage his anticoagulation. He does not have one. We have given him options to choose from to find a new PCP  It is  unlikely that his headaches are from eliquis but he will let us know if they persist or get worse with time and we can see if we can switch him to some other agent at that time.    Total face to face encounter time for this patient visit was 30 min. >50% of the time was  spent in counseling and coordination of care.     Thank you for this kind referral and the opportunity to participate in the care of this patient   Visit Diagnosis 1. Acute saddle pulmonary embolism with acute cor pulmonale (HCC)   2. Acute deep vein thrombosis (  DVT) of proximal vein of right lower extremity ([place])     Dr. Randa Evens, MD, MPH [place] [facility] Pager- [phone] 11/27/2016

## 2016-11-27 NOTE — Progress Notes (Signed)
Here for initial evaluation. Per pt wife undergoing cancer tx at [facility] further treatment available they stated. Per pt while he and wife away on vacation his mother suddenly passed away. Pt out of work at this time.

## 2016-12-06 ENCOUNTER — Ambulatory Visit: Payer: 59 | Admitting: Oncology

## 2016-12-13 ENCOUNTER — Telehealth: Payer: Self-pay | Admitting: *Deleted

## 2016-12-13 NOTE — Telephone Encounter (Signed)
Left message for patient to return call for result of ONO.

## 2016-12-14 NOTE — Telephone Encounter (Signed)
2nd attempt to contact patient made. Left message.

## 2016-12-18 ENCOUNTER — Telehealth: Payer: Self-pay | Admitting: Internal Medicine

## 2016-12-18 ENCOUNTER — Telehealth: Payer: Self-pay | Admitting: *Deleted

## 2016-12-18 NOTE — Telephone Encounter (Signed)
Patient calling the office for samples of medication:   1.  What medication and dosage are you requesting samples for? Eliquis   2.  Are you currently out of this medication?   He is not able to get this until June, insurance won't cover it until then  Just needs until then  Please advise.

## 2016-12-18 NOTE — Telephone Encounter (Signed)
-----   Message from Courtney Parishristina J Woods sent at 11/27/2016  4:26 PM EDT ----- I will see what I can find out. ----- Message ----- From: Corene CorneaSharon Y Anajah Sterbenz, RN Sent: 11/27/2016   3:12 PM To: Courtney Parishristina J Woods  Pt is a new pt today and he has a double PE and  DVT. And his wife has metastatic cancer. Dr. Smith Robertao would like to do a coaguable work up but it is expensive tests and pt does not want to do it without knowing the cost .  Can you check into the following tests for cost and he wants to know how much insurance will pay and I am not sure if you can do that.  Tests: Beta 2 microglobulin Anti cardio lipin atb Hex phase or dRVVT Factor V leiden  Let me know  Thanks Cordelia PenSherry

## 2016-12-18 NOTE — Telephone Encounter (Signed)
r/t call to patient was disconnected. Called back and left message. We do not have samples of Eliquis. If patient would like rx sent please call our office back.

## 2016-12-18 NOTE — Telephone Encounter (Signed)
Called pt to go over the test cost that Dr Smith Robertao wanted done on pt because of his DVT, PE.  He did not want to hear about the costs at this time because he is started a new job and his insurance will not start til June 1.  He would like me to call him back then.  Then he states that he is almost out of eliquis and has no insurance to pay for it and would like to see if we have samples. I told him that I will ask pharmacy and let him know after I check with them.

## 2016-12-19 NOTE — Telephone Encounter (Signed)
Patient states he changed jobs and has new insurance effective 01/04/17. Patient was informed he will need to repeat ONO due to 30 day window. Patient is ok to repeat and will bring new insurance card by the office when he gets it.  Results ONO 2L 02 at bedtime.

## 2016-12-20 NOTE — Telephone Encounter (Signed)
I actually spoke to [employer] yest. And she was ok trying to get med for him. pahrmacy does not have samples for him, Ree KidaJack can't afford to pay off fund for a 30 day supply due to the cost of med. I spoke to medication management and was able to get him a one time one month supply. Dr. B wrote rx and I faxed it over and went and picked it up and called pt to tell him that I have a one time rx for him and then his insurance will kick in and he will get it from reg. Pharmacy through his insurance starting 6/1. He iwll have his wife Jacinto Halimammy Rowland come by and pick med up because he is in [place] on a job site.

## 2017-01-11 ENCOUNTER — Encounter: Payer: Self-pay | Admitting: Family Medicine

## 2017-01-11 ENCOUNTER — Ambulatory Visit (INDEPENDENT_AMBULATORY_CARE_PROVIDER_SITE_OTHER): Payer: BLUE CROSS/BLUE SHIELD | Admitting: Family Medicine

## 2017-01-11 VITALS — BP 100/72 | HR 119 | Temp 98.0°F | Resp 16 | Ht 70.0 in | Wt 245.0 lb

## 2017-01-11 DIAGNOSIS — R319 Hematuria, unspecified: Secondary | ICD-10-CM | POA: Diagnosis not present

## 2017-01-11 DIAGNOSIS — Z130 Encounter for screening for diseases of the blood and blood-forming organs and certain disorders involving the immune mechanism: Secondary | ICD-10-CM | POA: Diagnosis not present

## 2017-01-11 DIAGNOSIS — E1140 Type 2 diabetes mellitus with diabetic neuropathy, unspecified: Secondary | ICD-10-CM

## 2017-01-11 DIAGNOSIS — I2692 Saddle embolus of pulmonary artery without acute cor pulmonale: Secondary | ICD-10-CM | POA: Diagnosis not present

## 2017-01-11 DIAGNOSIS — I2699 Other pulmonary embolism without acute cor pulmonale: Secondary | ICD-10-CM | POA: Insufficient documentation

## 2017-01-11 DIAGNOSIS — E1165 Type 2 diabetes mellitus with hyperglycemia: Secondary | ICD-10-CM

## 2017-01-11 DIAGNOSIS — IMO0002 Reserved for concepts with insufficient information to code with codable children: Secondary | ICD-10-CM

## 2017-01-11 LAB — POCT URINALYSIS DIPSTICK
Bilirubin, UA: NEGATIVE
Blood, UA: NEGATIVE
Glucose, UA: NEGATIVE
Ketones, UA: NEGATIVE
Leukocytes, UA: NEGATIVE
NITRITE UA: NEGATIVE
PH UA: 5.5 (ref 5.0–8.0)
PROTEIN UA: NEGATIVE
Spec Grav, UA: 1.025 (ref 1.010–1.025)
UROBILINOGEN UA: 0.2 U/dL

## 2017-01-11 LAB — CBC
HCT: 46.6 % (ref 38.5–50.0)
HEMOGLOBIN: 15.6 g/dL (ref 13.2–17.1)
MCH: 30.6 pg (ref 27.0–33.0)
MCHC: 33.5 g/dL (ref 32.0–36.0)
MCV: 91.6 fL (ref 80.0–100.0)
MPV: 9.8 fL (ref 7.5–12.5)
PLATELETS: 207 10*3/uL (ref 140–400)
RBC: 5.09 MIL/uL (ref 4.20–5.80)
RDW: 13.5 % (ref 11.0–15.0)
WBC: 12.3 10*3/uL — ABNORMAL HIGH (ref 3.8–10.8)

## 2017-01-11 MED ORDER — EMPAGLIFLOZIN 10 MG PO TABS: 10.0000 mg | ORAL_TABLET | Freq: Every day | ORAL | 1 refills | Status: DC

## 2017-01-11 NOTE — Progress Notes (Signed)
Subjective:  Patient ID: Paul Keller, male    DOB: [birth year]  Age: 46 y.o. MRN: [id number]  CC: Establish care with me  HPI:  46 year old male with DM-2, recent syncopal episode/admission for saddle PE (11/2016) presents to establish care. He is not a new patient as he has been seen in the past 3 years.  DM-2  Uncontrolled.  Most recent A1C was 13.1 (11/14/16).  He has been restarted on his medication and has been doing fairly well. He is currently on Metformin 1000 mg BID and is out of his Amaryl.  Fastings 130-150 per his report.   PE  Recent submassive, saddle PE.  Currently on Eliquis.   Has seen Heme/onc. They have recommend lifelong anticoagulation.  Endorses compliance.  Social Hx   Social History   Social History  . Marital status: Single    Spouse name: N/A  . Number of children: 2  . Years of education: 14   Occupational History  . Risk analyst     Social History Main Topics  . Smoking status: Never Smoker  . Smokeless tobacco: Never Used  . Alcohol use No  . Drug use: No  . Sexual activity: Yes   Other Topics Concern  . None   Social History Narrative   Caffeine: occasional   Lives with GF and step daughter, 3 dogs, 1 cat   divorced   Occupation: road Games developer    Edu: Automotive engineer   Activity: work   Diet: good water, fruits/vegetables daily, avoids fried foods and starches    Review of Systems  Eyes: Positive for visual disturbance.  Gastrointestinal: Positive for diarrhea.  Endocrine: Positive for polydipsia and polyuria.  Genitourinary:       Sexual difficulty. Reports of prior microscopic hematuria.   Neurological:       Recent syncopal episode (11/2016).    Objective:  BP 100/72   Pulse (!) 119   Temp 98 F (36.7 C) (Oral)   Resp 16   Ht 5\' 10"  (1.778 m)   Wt 245 lb (111.1 kg)   SpO2 98%   BMI 35.15 kg/m   BP/Weight 01/11/2017 11/27/2016 11/19/2016  Systolic BP 100 115 110  Diastolic  BP 72 77 76  Wt. (Lbs) 245 237 237  BMI 35.15 30.74 32.14    Physical Exam  Constitutional: He is oriented to person, place, and time. He appears well-developed. No distress.  Cardiovascular: Regular rhythm.   Tachycardic.  Pulmonary/Chest: Effort normal. He has no wheezes. He has no rales.  Neurological: He is alert and oriented to person, place, and time.  Psychiatric: He has a normal mood and affect.  Vitals reviewed.   Lab Results  Component Value Date   GLUCOSE 256 (H) 07/06/2014   LDLDIRECT 106.0 07/06/2014   NA 135 07/06/2014   K 4.8 07/06/2014   CL 100 07/06/2014   CREATININE 0.9 07/06/2014   BUN 17 07/06/2014   CO2 26 07/06/2014   HGBA1C 10.2 (H) 07/06/2014   MICROALBUR 1.1 07/06/2014    Assessment & Plan:   Problem List Items Addressed This Visit      Cardiovascular and Mediastinum   Pulmonary embolism (HCC)    New problem. Advised compliance with Eliquis. Lifelong anticoagulation.        Endocrine   Type 2 diabetes mellitus, uncontrolled, with neuropathy (HCC) - Primary    Most recent A1C uncontrolled. Continue metformin. Stopping Amaryl and starting Jardiance.       Relevant  Medications   metFORMIN (GLUCOPHAGE) 1000 MG tablet   empagliflozin (JARDIANCE) 10 MG TABS tablet   Other Relevant Orders   Comprehensive metabolic panel   Lipid panel   Microalbumin / creatinine urine ratio     Other   Hematuria    Patient reporting that he has had microscopic hematuria in the past. UA negative today.      Relevant Orders   POCT Urinalysis Dipstick (Completed)    Other Visit Diagnoses    Screening for deficiency anemia       Relevant Orders   CBC      Meds ordered this encounter  Medications  . metFORMIN (GLUCOPHAGE) 1000 MG tablet    Sig: 1,000 mg 2 (two) times daily with a meal.   . empagliflozin (JARDIANCE) 10 MG TABS tablet    Sig: Take 10 mg by mouth daily.    Dispense:  90 tablet    Refill:  1   Follow-up: 1 month  Mindi Akerson  DO [facility] [place]

## 2017-01-11 NOTE — Assessment & Plan Note (Signed)
Patient reporting that he has had microscopic hematuria in the past. UA negative today.

## 2017-01-11 NOTE — Assessment & Plan Note (Signed)
New problem. Advised compliance with Eliquis. Lifelong anticoagulation.

## 2017-01-11 NOTE — Patient Instructions (Addendum)
Medications as prescribed.  Follow up in 1 month (after the 11th).  Take care  Dr. Adriana Simasook

## 2017-01-11 NOTE — Assessment & Plan Note (Signed)
Most recent A1C uncontrolled. Continue metformin. Stopping Amaryl and starting Jardiance.

## 2017-01-12 LAB — LIPID PANEL
CHOLESTEROL: 155 mg/dL (ref <200)
HDL: 40 mg/dL — ABNORMAL LOW (ref >40)
LDL CALC: 69 mg/dL (ref <100)
TRIGLYCERIDES: 229 mg/dL — AB (ref <150)
Total CHOL/HDL Ratio: 3.9 Ratio (ref <5.0)
VLDL: 46 mg/dL — ABNORMAL HIGH (ref <30)

## 2017-01-12 LAB — COMPREHENSIVE METABOLIC PANEL
ALBUMIN: 4.4 g/dL (ref 3.6–5.1)
ALT: 23 U/L (ref 9–46)
AST: 16 U/L (ref 10–40)
Alkaline Phosphatase: 63 U/L (ref 40–115)
BUN: 23 mg/dL (ref 7–25)
CHLORIDE: 98 mmol/L (ref 98–110)
CO2: 25 mmol/L (ref 20–31)
CREATININE: 1.08 mg/dL (ref 0.60–1.35)
Calcium: 10.2 mg/dL (ref 8.6–10.3)
Glucose, Bld: 182 mg/dL — ABNORMAL HIGH (ref 65–99)
POTASSIUM: 4.9 mmol/L (ref 3.5–5.3)
SODIUM: 136 mmol/L (ref 135–146)
Total Bilirubin: 0.5 mg/dL (ref 0.2–1.2)
Total Protein: 7.3 g/dL (ref 6.1–8.1)

## 2017-01-12 LAB — MICROALBUMIN / CREATININE URINE RATIO
CREATININE, URINE: 160 mg/dL (ref 20–370)
Microalb Creat Ratio: 19 mcg/mg creat (ref <30)
Microalb, Ur: 3.1 mg/dL

## 2017-01-15 ENCOUNTER — Encounter: Payer: Self-pay | Admitting: Family Medicine

## 2017-01-16 ENCOUNTER — Other Ambulatory Visit: Payer: Self-pay | Admitting: Family Medicine

## 2017-01-16 MED ORDER — GABAPENTIN 300 MG PO CAPS: 300.0000 mg | ORAL_CAPSULE | Freq: Three times a day (TID) | ORAL | 3 refills | Status: DC

## 2017-02-07 ENCOUNTER — Encounter: Payer: Self-pay | Admitting: Family Medicine

## 2017-02-08 ENCOUNTER — Other Ambulatory Visit: Payer: Self-pay | Admitting: Family Medicine

## 2017-02-08 MED ORDER — METFORMIN HCL 1000 MG PO TABS: 1000.0000 mg | ORAL_TABLET | Freq: Two times a day (BID) | ORAL | 3 refills | Status: DC

## 2017-03-05 ENCOUNTER — Other Ambulatory Visit: Payer: Self-pay

## 2017-03-05 ENCOUNTER — Ambulatory Visit (INDEPENDENT_AMBULATORY_CARE_PROVIDER_SITE_OTHER): Payer: BLUE CROSS/BLUE SHIELD | Admitting: Family Medicine

## 2017-03-05 DIAGNOSIS — E1165 Type 2 diabetes mellitus with hyperglycemia: Secondary | ICD-10-CM

## 2017-03-05 DIAGNOSIS — IMO0002 Reserved for concepts with insufficient information to code with codable children: Secondary | ICD-10-CM

## 2017-03-05 DIAGNOSIS — I2692 Saddle embolus of pulmonary artery without acute cor pulmonale: Secondary | ICD-10-CM

## 2017-03-05 DIAGNOSIS — E1140 Type 2 diabetes mellitus with diabetic neuropathy, unspecified: Secondary | ICD-10-CM

## 2017-03-05 DIAGNOSIS — E781 Pure hyperglyceridemia: Secondary | ICD-10-CM | POA: Insufficient documentation

## 2017-03-05 LAB — POCT GLYCOSYLATED HEMOGLOBIN (HGB A1C): Hemoglobin A1C: 9

## 2017-03-05 MED ORDER — EMPAGLIFLOZIN 25 MG PO TABS: 25.0000 mg | ORAL_TABLET | Freq: Every day | ORAL | 1 refills | Status: DC

## 2017-03-05 MED ORDER — GLUCOSE BLOOD VI STRP: ORAL_STRIP | 3 refills | Status: DC

## 2017-03-05 MED ORDER — GLUCOSE BLOOD VI STRP: ORAL_STRIP | 3 refills | Status: AC

## 2017-03-05 MED ORDER — APIXABAN 5 MG PO TABS: 5.0000 mg | ORAL_TABLET | Freq: Two times a day (BID) | ORAL | 3 refills | Status: DC

## 2017-03-05 NOTE — Progress Notes (Signed)
Subjective:  Patient ID: Paul Keller, male    DOB: [birth year]  Age: 46 y.o. MRN: [id number]  CC: Follow up DM  HPI:  46 year old male with a family history of early CAD, PE, DM-2, ED, Hypertriglyceridemia presents for follow-up.  DM 2  Blood sugars at goal per report. 130s fasting.  Endorses compliance with metformin and Jardiance.  No hypoglycemia.  Occasional diarrhea from metformin.  He states that he's doing well.  No polyuria or polydipsia. No chest pain or shortness breath. No other complaints or concerns at this time.  PE  Stable on Eliquis. Needs refill.  Social Hx   Social History   Social History  . Marital status: Single    Spouse name: N/A  . Number of children: 2  . 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 Smoking status: Never Smoker  . Smokeless tobacco: Never Used  . Alcohol use No  . Drug use: No  . Sexual activity: Yes   Other Topics Concern  . Not on file   Social History Narrative   Caffeine: occasional   Lives with GF and step daughter, 3 dogs, 1 cat   divorced   Occupation: road Games developerconstruction supervisor    Edu: College   Activity: work   Diet: good water, fruits/vegetables daily, avoids fried foods and starches    Review of Systems  Respiratory: Negative.   Cardiovascular: Negative.   Endocrine: Negative.    Objective:  BP 110/80 (BP Location: Left Arm, Patient Position: Sitting, Cuff Size: Normal)   Pulse 93   Temp 97.9 F (36.6 C) (Oral)   Wt 242 lb 8 oz (110 kg)   SpO2 98%   BMI 34.80 kg/m   BP/Weight 03/05/2017 01/11/2017 11/27/2016  Systolic BP 110 100 115  Diastolic BP 80 72 77  Wt. (Lbs) 242.5 245 237  BMI 34.8 35.15 30.74   Physical Exam  Constitutional: He is oriented to person, place, and time. He appears well-developed. No distress.  Cardiovascular: Normal rate and regular rhythm.   No murmur heard. Pulmonary/Chest:  Effort normal. He has no wheezes. He has no rales.  Neurological: He is alert and oriented to person, place, and time.  Psychiatric: He has a normal mood and affect.  Vitals reviewed.   Lab Results  Component Value Date   WBC 12.3 (H) 01/11/2017   HGB 15.6 01/11/2017   HCT 46.6 01/11/2017   PLT 207 01/11/2017   GLUCOSE 182 (H) 01/11/2017   CHOL 155 01/11/2017   TRIG 229 (H) 01/11/2017   HDL 40 (L) 01/11/2017   LDLDIRECT 106.0 07/06/2014   LDLCALC 69 01/11/2017   ALT 23 01/11/2017   AST 16 01/11/2017   NA 136 01/11/2017   K 4.9 01/11/2017   CL 98 01/11/2017   CREATININE 1.08 01/11/2017   BUN 23 01/11/2017   CO2 25 01/11/2017   HGBA1C 10.2 (H) 07/06/2014   MICROALBUR 3.1 01/11/2017    Assessment & Plan:   Problem List Items Addressed This Visit      Cardiovascular and Mediastinum   Pulmonary embolism (HCC)    Stable on Eliquis. Refilling today. Lifelong anticoagulation.      Relevant Medications   apixaban (ELIQUIS) 5 MG TABS tablet     Endocrine   Type 2 diabetes mellitus, uncontrolled, with neuropathy (HCC)    Improving but not at goal. A1C 9.0 today. Increasing Jardiance. Continue  metformin. Follow up with Pharm D in 2 weeks. Likely needs addition of GLP1.      Relevant Medications   empagliflozin (JARDIANCE) 25 MG TABS tablet   Other Relevant Orders   POCT glycosylated hemoglobin (Hb A1C)      Meds ordered this encounter  Medications  . apixaban (ELIQUIS) 5 MG TABS tablet    Sig: Take 1 tablet (5 mg total) by mouth 2 (two) times daily.    Dispense:  60 tablet    Refill:  3  . empagliflozin (JARDIANCE) 25 MG TABS tablet    Sig: Take 25 mg by mouth daily.    Dispense:  90 tablet    Refill:  1     Follow-up: Return in about 2 weeks (around 03/19/2017).  Everlene OtherJayce Teaghan Formica DO [facility] [place]

## 2017-03-05 NOTE — Assessment & Plan Note (Signed)
Stable on Eliquis. Refilling today. Lifelong anticoagulation.

## 2017-03-05 NOTE — Assessment & Plan Note (Signed)
Improving but not at goal. A1C 9.0 today. Increasing Jardiance. Continue metformin. Follow up with Pharm D in 2 weeks. Likely needs addition of GLP1.

## 2017-03-05 NOTE — Patient Instructions (Signed)
I have increased the Jardiance.  Continue the metformin.  See my pharmacist in 2 weeks.  Take care   Dr. Adriana Simasook

## 2017-03-06 ENCOUNTER — Telehealth: Payer: Self-pay | Admitting: Family Medicine

## 2017-03-06 MED ORDER — EMPAGLIFLOZIN 25 MG PO TABS: 25.0000 mg | ORAL_TABLET | Freq: Every day | ORAL | 1 refills | Status: DC

## 2017-03-06 NOTE — Telephone Encounter (Signed)
Script resent

## 2017-03-06 NOTE — Telephone Encounter (Signed)
Pt is calling back regarding his medication of empagliflozin (JARDIANCE) 25 MG TABS tablet it needs to go to:  Pharmacy is [id number] - [place], [place] - [address]  Call pt @ [phone]. Thank you!

## 2017-03-06 NOTE — Telephone Encounter (Signed)
Pcalled and stated that his medications were called into the wrong pharmacy [facility] [place].

## 2017-03-19 ENCOUNTER — Ambulatory Visit: Payer: BLUE CROSS/BLUE SHIELD | Admitting: Family Medicine

## 2017-03-19 DIAGNOSIS — Z0289 Encounter for other administrative examinations: Secondary | ICD-10-CM

## 2017-04-22 ENCOUNTER — Encounter: Payer: Self-pay | Admitting: Emergency Medicine

## 2017-04-22 ENCOUNTER — Inpatient Hospital Stay
Admission: EM | Admit: 2017-04-22 | Discharge: 2017-04-25 | DRG: 558 | Disposition: A | Discharge status: Home / Self Care | Payer: BLUE CROSS/BLUE SHIELD | Attending: Internal Medicine | Admitting: Internal Medicine

## 2017-04-22 ENCOUNTER — Emergency Department: Payer: BLUE CROSS/BLUE SHIELD

## 2017-04-22 DIAGNOSIS — E119 Type 2 diabetes mellitus without complications: Secondary | ICD-10-CM | POA: Diagnosis present

## 2017-04-22 DIAGNOSIS — L03113 Cellulitis of right upper limb: Secondary | ICD-10-CM | POA: Diagnosis present

## 2017-04-22 DIAGNOSIS — Z7984 Long term (current) use of oral hypoglycemic drugs: Secondary | ICD-10-CM

## 2017-04-22 DIAGNOSIS — M71121 Other infective bursitis, right elbow: Secondary | ICD-10-CM | POA: Diagnosis present

## 2017-04-22 DIAGNOSIS — L0390 Cellulitis, unspecified: Secondary | ICD-10-CM

## 2017-04-22 DIAGNOSIS — Z86711 Personal history of pulmonary embolism: Secondary | ICD-10-CM

## 2017-04-22 DIAGNOSIS — Z79899 Other long term (current) drug therapy: Secondary | ICD-10-CM

## 2017-04-22 DIAGNOSIS — Z880 Allergy status to penicillin: Secondary | ICD-10-CM | POA: Diagnosis not present

## 2017-04-22 DIAGNOSIS — M19021 Primary osteoarthritis, right elbow: Secondary | ICD-10-CM | POA: Diagnosis present

## 2017-04-22 DIAGNOSIS — M009 Pyogenic arthritis, unspecified: Secondary | ICD-10-CM

## 2017-04-22 DIAGNOSIS — Z7901 Long term (current) use of anticoagulants: Secondary | ICD-10-CM

## 2017-04-22 LAB — CBC
HEMATOCRIT: 46.5 % (ref 40.0–52.0)
HEMOGLOBIN: 16 g/dL (ref 13.0–18.0)
MCH: 31.3 pg (ref 26.0–34.0)
MCHC: 34.3 g/dL (ref 32.0–36.0)
MCV: 91 fL (ref 80.0–100.0)
Platelets: 198 10*3/uL (ref 150–440)
RBC: 5.11 MIL/uL (ref 4.40–5.90)
RDW: 12.9 % (ref 11.5–14.5)
WBC: 14 10*3/uL — AB (ref 3.8–10.6)

## 2017-04-22 LAB — COMPREHENSIVE METABOLIC PANEL
ALBUMIN: 4 g/dL (ref 3.5–5.0)
ALK PHOS: 73 U/L (ref 38–126)
ALT: 32 U/L (ref 17–63)
ANION GAP: 9 (ref 5–15)
AST: 26 U/L (ref 15–41)
BILIRUBIN TOTAL: 0.7 mg/dL (ref 0.3–1.2)
BUN: 13 mg/dL (ref 6–20)
CALCIUM: 9.7 mg/dL (ref 8.9–10.3)
CO2: 26 mmol/L (ref 22–32)
Chloride: 101 mmol/L (ref 101–111)
Creatinine, Ser: 0.91 mg/dL (ref 0.61–1.24)
GFR calc Af Amer: >60 mL/min (ref >60)
GFR calc non Af Amer: >60 mL/min (ref >60)
GLUCOSE: 185 mg/dL — AB (ref 65–99)
POTASSIUM: 4.1 mmol/L (ref 3.5–5.1)
SODIUM: 136 mmol/L (ref 135–145)
Total Protein: 8.5 g/dL — ABNORMAL HIGH (ref 6.5–8.1)

## 2017-04-22 LAB — GLUCOSE, CAPILLARY
Glucose-Capillary: 219 mg/dL — ABNORMAL HIGH (ref 65–99)
Glucose-Capillary: 195 mg/dL — ABNORMAL HIGH (ref 65–99)

## 2017-04-22 LAB — LACTIC ACID, PLASMA
LACTIC ACID, VENOUS: 1.1 mmol/L (ref 0.5–1.9)
Lactic Acid, Venous: 1.7 mmol/L (ref 0.5–1.9)

## 2017-04-22 MED ORDER — ACETAMINOPHEN 325 MG PO TABS
650.0000 mg | ORAL_TABLET | Freq: Four times a day (QID) | ORAL | Status: DC | PRN
  Administered 2017-04-23 (×2): 650 mg via ORAL
  Filled 2017-04-22 (×2): qty 2

## 2017-04-22 MED ORDER — MORPHINE SULFATE (PF) 2 MG/ML IV SOLN
2.0000 mg | INTRAVENOUS | Status: DC | PRN
  Administered 2017-04-22: 2 mg via INTRAVENOUS
  Filled 2017-04-22: qty 1

## 2017-04-22 MED ORDER — KETOROLAC TROMETHAMINE 30 MG/ML IJ SOLN
30.0000 mg | Freq: Once | INTRAMUSCULAR | Status: AC
  Administered 2017-04-22: 30 mg via INTRAVENOUS
  Filled 2017-04-22: qty 1

## 2017-04-22 MED ORDER — APIXABAN 5 MG PO TABS
5.0000 mg | ORAL_TABLET | Freq: Two times a day (BID) | ORAL | Status: DC
  Administered 2017-04-22 – 2017-04-25 (×6): 5 mg via ORAL
  Filled 2017-04-22 (×6): qty 1

## 2017-04-22 MED ORDER — OXYCODONE-ACETAMINOPHEN 5-325 MG PO TABS
1.0000 | ORAL_TABLET | ORAL | Status: DC | PRN
  Administered 2017-04-22 – 2017-04-24 (×7): 1 via ORAL
  Filled 2017-04-22 (×7): qty 1

## 2017-04-22 MED ORDER — PIPERACILLIN-TAZOBACTAM 3.375 G IVPB 30 MIN
INTRAVENOUS | Status: AC
  Administered 2017-04-22: 3.375 g via INTRAVENOUS
  Filled 2017-04-22: qty 50

## 2017-04-22 MED ORDER — DOCUSATE SODIUM 100 MG PO CAPS
100.0000 mg | ORAL_CAPSULE | Freq: Two times a day (BID) | ORAL | Status: DC | PRN
  Filled 2017-04-22: qty 1

## 2017-04-22 MED ORDER — VANCOMYCIN HCL IN DEXTROSE 1-5 GM/200ML-% IV SOLN
1000.0000 mg | Freq: Once | INTRAVENOUS | Status: AC
  Administered 2017-04-22: 1000 mg via INTRAVENOUS
  Filled 2017-04-22: qty 200

## 2017-04-22 MED ORDER — VANCOMYCIN HCL 10 G IV SOLR
1500.0000 mg | Freq: Two times a day (BID) | INTRAVENOUS | Status: DC
  Administered 2017-04-22 – 2017-04-23 (×2): 1500 mg via INTRAVENOUS
  Filled 2017-04-22 (×4): qty 1500

## 2017-04-22 MED ORDER — METFORMIN HCL 500 MG PO TABS
500.0000 mg | ORAL_TABLET | Freq: Two times a day (BID) | ORAL | Status: DC
  Administered 2017-04-22 – 2017-04-24 (×4): 500 mg via ORAL
  Filled 2017-04-22 (×4): qty 1

## 2017-04-22 MED ORDER — GABAPENTIN 300 MG PO CAPS
300.0000 mg | ORAL_CAPSULE | Freq: Three times a day (TID) | ORAL | Status: DC
  Administered 2017-04-22 – 2017-04-25 (×8): 300 mg via ORAL
  Filled 2017-04-22 (×8): qty 1

## 2017-04-22 MED ORDER — DOCUSATE SODIUM 100 MG PO CAPS
100.0000 mg | ORAL_CAPSULE | Freq: Two times a day (BID) | ORAL | Status: DC
  Administered 2017-04-22 – 2017-04-25 (×7): 100 mg via ORAL
  Filled 2017-04-22 (×6): qty 1

## 2017-04-22 MED ORDER — PIPERACILLIN-TAZOBACTAM 3.375 G IVPB 30 MIN
3.3750 g | Freq: Once | INTRAVENOUS | Status: AC
  Administered 2017-04-22: 3.375 g via INTRAVENOUS

## 2017-04-22 MED ORDER — INSULIN ASPART 100 UNIT/ML ~~LOC~~ SOLN
0.0000 [IU] | Freq: Three times a day (TID) | SUBCUTANEOUS | Status: DC
  Administered 2017-04-22: 3 [IU] via SUBCUTANEOUS
  Administered 2017-04-23 (×2): 2 [IU] via SUBCUTANEOUS
  Administered 2017-04-23: 3 [IU] via SUBCUTANEOUS
  Administered 2017-04-24 (×2): 2 [IU] via SUBCUTANEOUS
  Administered 2017-04-24: 3 [IU] via SUBCUTANEOUS
  Administered 2017-04-25 (×2): 2 [IU] via SUBCUTANEOUS
  Filled 2017-04-22 (×9): qty 1

## 2017-04-22 MED ORDER — PIPERACILLIN-TAZOBACTAM 3.375 G IVPB
3.3750 g | Freq: Three times a day (TID) | INTRAVENOUS | Status: DC
  Administered 2017-04-23 (×2): 3.375 g via INTRAVENOUS
  Filled 2017-04-22 (×2): qty 50

## 2017-04-22 NOTE — ED Triage Notes (Signed)
Pt to ed with c/o right elbow pain, swelling, redness, since Thursday.  Pt states started swelling and has progressively gotten worse.  Pt has severe swelling to right elbow, with redness streaking down arm.  Pt skin warm to touch, reports pain worse with movement of arm.

## 2017-04-22 NOTE — ED Notes (Signed)
Dr. Kinner to bedside at this time.  

## 2017-04-22 NOTE — Consult Note (Signed)
ORTHOPAEDIC CONSULTATION  REQUESTING PHYSICIAN: Westall Basta, *  Chief Complaint: Right elbow redness and swelling  HPI: Paul Keller is a 46 y.o. male who complains of  increased pain, redness and swelling over the right elbow since Thursday of last week, 4 days ago.  He does not recall any injury.  He works as a Futures trader.  He is diabetic and is on Eliquis for pulmonary embolism earlier this year.  He has not had any shaking chills or significant fever.  He says he has felt a little under the weather last few days.  He presented to the emergency room today due to increased redness and swelling and pain.  Lab work shows a white blood count of 14,000.  He has no significant fever.  Past Medical History:  Diagnosis Date  . Diabetes ([place])   . Pulmonary emboli ([place]) 11/08/2016   2 clots in lungs and left leg per pt  . Type II or unspecified type diabetes mellitus without mention of complication, uncontrolled 2008   completed DSME   Past Surgical History:  Procedure Laterality Date  . COLONOSCOPY WITH ESOPHAGOGASTRODUODENOSCOPY (EGD)  2013   normal-found scar tissue and removed (Dr. [name])   Social History   Social History  . Marital status: Single    Spouse name: N/A  . Number of children: 2  . Years of education: 14   Occupational History  . Nutritional therapist     Social History Main Topics  . Smoking status: Never Smoker  . Smokeless tobacco: Never Used  . Alcohol use No  . Drug use: No  . Sexual activity: Yes   Other Topics Concern  . None   Social History Narrative   Caffeine: occasional   Lives with GF and step daughter, 3 dogs, 1 cat   divorced   Occupation: road Futures trader    Edu: Secretary/administrator   Activity: work   Diet: good water, fruits/vegetables daily, avoids fried foods and starches   Family History  Problem Relation Age of Onset  . Cancer Unknown        unsure details - mother adopted, no contact  with father  . Diabetes Mother   . Hypertension Mother   . Heart failure Mother        CHF  . Diabetes Father   . Hypertension Father   . CAD Father        MI  . CAD Brother 22       MI x4  . Stroke Neg Hx    Allergies  Allergen Reactions  . Penicillins Rash    Has patient had a PCN reaction causing immediate rash, facial/tongue/throat swelling, SOB or lightheadedness with hypotension: No Has patient had a PCN reaction causing severe rash involving mucus membranes or skin necrosis: No Has patient had a PCN reaction that required hospitalization: No Has patient had a PCN reaction occurring within the last 10 years: No If all of the above answers are "NO", then may proceed with Cephalosporin use.    Prior to Admission medications   Medication Sig Start Date End Date Taking? Authorizing Provider  acetaminophen (TYLENOL) 325 MG tablet Take 650 mg by mouth every 6 (six) hours as needed.   Yes [provider]  apixaban (ELIQUIS) 5 MG TABS tablet Take 1 tablet (5 mg total) by mouth 2 (two) times daily. Patient taking differently: Take 10 mg by mouth 2 (two) times daily.  03/05/17  Yes Cook, Jayce G, DO  empagliflozin (  JARDIANCE) 25 MG TABS tablet Take 25 mg by mouth daily. 03/06/17  Yes Cook, Jayce G, DO  gabapentin (NEURONTIN) 300 MG capsule Take 1 capsule (300 mg total) by mouth 3 (three) times daily. 01/16/17  Yes Cook, Jayce G, DO  metFORMIN (GLUCOPHAGE) 1000 MG tablet TAKE 1 TABLET WITH MEALS TWICE A DAY FOR 90 DAYS 02/11/17  Yes Cook, Jayce G, DO  Blood Glucose Monitoring Suppl (ONE TOUCH ULTRA SYSTEM KIT) W/DEVICE KIT 1 kit by Does not apply route once. 07/06/14   [name], MD  glucose blood test strip Use as instructed to check sugar once daily or as needed. Dx: E11.40 03/05/17   [name], DO  Lancets [facility] ULTRASOFT) lancets Use as instructed to check sugar once daily or as needed. Dx: E11.40 07/06/14   [name], MD   Dg Elbow Complete Right  Result  Date: 04/22/2017 CLINICAL DATA:  Infection/swelling. Pain and redness in the right elbow since Thursday, progressing. EXAM: RIGHT ELBOW - COMPLETE 3+ VIEW COMPARISON:  None. FINDINGS: Prominent soft tissue swelling about the medial and posterior elbow. No fracture or erosion. Negative for joint effusion. No soft tissue emphysema. IMPRESSION: Soft tissue swelling in keeping with history of skin infection. No soft tissue emphysema or opaque foreign body. No osseous findings. Electronically Signed   By: Monte Fantasia M.D.   On: 04/22/2017 12:46    Positive ROS: All other systems have been reviewed and were otherwise negative with the exception of those mentioned in the HPI and as above.  Physical Exam: General: Alert, no acute distress Cardiovascular: No pedal edema Respiratory: No cyanosis, no use of accessory musculature GI: No organomegaly, abdomen is soft and non-tender Skin: No lesions in the area of chief complaint Neurologic: Sensation intact distally Psychiatric: Patient is competent for consent with normal mood and affect Lymphatic: No axillary or cervical lymphadenopathy  MUSCULOSKELETAL: Patient alert cooperative.  The right olecranon bursa is swollen without any significant fluctuance.  There is surrounding cellulitis and swelling above and below this.  Passive motion of the elbow joint itself does not produce any pain.  Neurovascular status is good distally.  Assessment: Infected right olecranon bursa with cellulitis I do not believe the patient has any involvement of the elbow joint at all.  Plan: Continue IV antibiotics Moist heat Switched to by mouth antibiotics and 36 hours Follow-up in my office in 1 week.   Park Breed, MD [phone]   04/22/2017 7:42 PM

## 2017-04-22 NOTE — ED Notes (Signed)
Admitting MD at bedside at this time.

## 2017-04-22 NOTE — ED Provider Notes (Signed)
[facility] Emergency Department Provider Note   ____________________________________________    I have reviewed the triage vital signs and the nursing notes.   HISTORY  Chief Complaint Elbow Pain     HPI Paul Keller is a 46 y.o. male who presents with complaints of right elbow pain and swelling. Patient reports over the last week he has had gradually worsening redness over the right elbow. He reports it is painful to touch and also he has pain when he fully extends his right elbow. He is keeping it flexed. He has never had this before. He denies injury to the area. No skin break. He does have diabetes. Denies fevers    Past Medical History:  Diagnosis Date  . Diabetes ([place])   . Pulmonary emboli ([place]) 11/08/2016   2 clots in lungs and left leg per pt  . Type II or unspecified type diabetes mellitus without mention of complication, uncontrolled 2008   completed DSME    Patient Active Problem List   Diagnosis Date Noted  . Hypertriglyceridemia 03/05/2017  . Pulmonary embolism ([place]) 01/11/2017  . Erectile dysfunction 07/06/2014  . Family history of early CAD 07/06/2014  . Type 2 diabetes mellitus, uncontrolled, with neuropathy ([place])     Past Surgical History:  Procedure Laterality Date  . COLONOSCOPY WITH ESOPHAGOGASTRODUODENOSCOPY (EGD)  2013   normal-found scar tissue and removed (Dr. [name])    Prior to Admission medications   Medication Sig Start Date End Date Taking? Authorizing Provider  acetaminophen (TYLENOL) 325 MG tablet Take 650 mg by mouth every 6 (six) hours as needed.    [provider]  apixaban (ELIQUIS) 5 MG TABS tablet Take 1 tablet (5 mg total) by mouth 2 (two) times daily. 03/05/17   [name], DO  Blood Glucose Monitoring Suppl (ONE TOUCH ULTRA SYSTEM KIT) W/DEVICE KIT 1 kit by Does not apply route once. 07/06/14   [name], MD  empagliflozin (JARDIANCE) 25 MG TABS tablet Take 25 mg by mouth  daily. 03/06/17   [name], DO  gabapentin (NEURONTIN) 300 MG capsule Take 1 capsule (300 mg total) by mouth 3 (three) times daily. 01/16/17   Thersa Salt G, DO  glucose blood test strip Use as instructed to check sugar once daily or as needed. Dx: E11.40 03/05/17   [name], DO  Lancets [facility] ULTRASOFT) lancets Use as instructed to check sugar once daily or as needed. Dx: E11.40 07/06/14   [name], MD  metFORMIN (GLUCOPHAGE) 1000 MG tablet TAKE 1 TABLET WITH MEALS TWICE A DAY FOR 90 DAYS 02/11/17   [name], DO     Allergies Penicillins  Family History  Problem Relation Age of Onset  . Cancer Unknown        unsure details - mother adopted, no contact with father  . Diabetes Mother   . Hypertension Mother   . Heart failure Mother        CHF  . Diabetes Father   . Hypertension Father   . CAD Father        MI  . CAD Brother 68       MI x4  . Stroke Neg Hx     Social History Social History  Substance Use Topics  . Smoking status: Never Smoker  . Smokeless tobacco: Never Used  . Alcohol use No    Review of Systems  Constitutional: No fever/chills Eyes: No visual changes.  ENT: No sore throat.  Cardiovascular: Denies chest pain. Respiratory: Denies shortness of breath. Gastrointestinal: No abdominal pain.  No nausea, no vomiting.   Genitourinary: Negative for dysuria. Musculoskeletal: Elbow pain as above Skin: Redness overlying elbow Neurological: Negative for headaches    ____________________________________________   PHYSICAL EXAM:  VITAL SIGNS: ED Triage Vitals  Enc Vitals Group     BP 04/22/17 1007 119/77     Pulse Rate 04/22/17 1007 (!) 102     Resp 04/22/17 1007 16     Temp 04/22/17 1007 (!) 96 F (35.6 C)     Temp Source 04/22/17 1007 Oral     SpO2 04/22/17 1007 95 %     Weight 04/22/17 1008 109.3 kg (241 lb)     Height 04/22/17 1008 1.829 m (6')     Head Circumference --      Peak Flow --      Pain Score 04/22/17 1017 5      Pain Loc --      Pain Edu? --      Excl. in GC? --     Constitutional: Alert and oriented. No acute distress. Pleasant and interactive Eyes: Conjunctivae are normal.   Nose: No congestion/rhinnorhea. Mouth/Throat: Mucous membranes are moist.   Neck:  Painless ROM Cardiovascular: Normal rate, regular rhythm. Grossly normal heart sounds.  Good peripheral circulation. Respiratory: Normal respiratory effort.  No retractions. Lungs CTAB. Gastrointestinal: Soft and nontender. No distention.  No CVA tenderness. Genitourinary: deferred Musculoskeletal: Patient with significant erythema and swelling of the right elbow and over the upper and lower arm. No significant swelling of bursa. Unable to extend the arm secondary to pain. Warm and well perfused, 2+ distal pulses Neurologic:  Normal speech and language. No gross focal neurologic deficits are appreciated.  Skin:  Skin is warm, dry. Erythema/cellulitis as above Psychiatric: Mood and affect are normal. Speech and behavior are normal.  ____________________________________________   LABS (all labs ordered are listed, but only abnormal results are displayed)  Labs Reviewed  CBC - Abnormal; Notable for the following:       Result Value   WBC 14.0 (*)    All other components within normal limits  COMPREHENSIVE METABOLIC PANEL - Abnormal; Notable for the following:    Glucose, Bld 185 (*)    Total Protein 8.5 (*)    All other components within normal limits  CULTURE, BLOOD (ROUTINE X 2)  CULTURE, BLOOD (ROUTINE X 2)  LACTIC ACID, PLASMA  LACTIC ACID, PLASMA   ____________________________________________  EKG   ____________________________________________  RADIOLOGY  X-ray elbow unremarkable ____________________________________________   PROCEDURES  Procedure(s) performed: No    Critical Care performed: no    ____________________________________________   INITIAL IMPRESSION / ASSESSMENT AND PLAN / ED  COURSE  Pertinent labs & imaging results that were available during my care of the patient were reviewed by me and considered in my medical decision making (see chart for details).  Patient presents with unilateral joint swelling, his exam is concerning for septic joint with overlying cellulitis. Discussed with Dr. Rosita Kea of orthopedics, he notes given the concern of overlying cellulitis arthrocentesis is not appropriate and recommends broad-spectrum antibiotics and admission.    ____________________________________________   FINAL CLINICAL IMPRESSION(S) / ED DIAGNOSES  Final diagnoses:  Pyogenic arthritis of right elbow, due to unspecified organism [facility])      NEW MEDICATIONS STARTED DURING THIS VISIT:  New Prescriptions   No medications on file     Note:  This document was prepared using Dragon voice recognition  software and may include unintentional dictation errors.    Lavonia Drafts, MD 04/22/17 1318

## 2017-04-22 NOTE — H&P (Signed)
Paul Keller at [place] NAME: Paul Keller    MR#:  [id number]  DATE OF BIRTH:  April 20, 1971  DATE OF ADMISSION:  04/22/2017  PRIMARY CARE PHYSICIAN: [name], DO   REQUESTING/REFERRING PHYSICIAN: [name]  CHIEF COMPLAINT:   Chief Complaint  Patient presents with  . Elbow Pain    HISTORY OF PRESENT ILLNESS: Paul Keller  is a 46 y.o. male with a known history of DM, PE in April 2018- on eliquis- for last 3-4 days have right elbow pain, swelling and worsening redness. Denies any injuries or bug bite. No fever. In ER noted soft tissue swelling, but have severe pain on movements, so suspected to have infected joint. ER physician spoke to ortho about drainage, but they suggested , due to skin infection, chances of spreading infection in joint, so currently manage IV Abx and pain management.  PAST MEDICAL HISTORY:   Past Medical History:  Diagnosis Date  . Diabetes (Paul Keller)   . Pulmonary emboli (Paul Keller) 11/08/2016   2 clots in lungs and left leg per pt  . Type II or unspecified type diabetes mellitus without mention of complication, uncontrolled 2008   completed DSME    PAST SURGICAL HISTORY: Past Surgical History:  Procedure Laterality Date  . COLONOSCOPY WITH ESOPHAGOGASTRODUODENOSCOPY (EGD)  2013   normal-found scar tissue and removed (Dr. Tiffany Keller)    SOCIAL HISTORY:  Social History  Substance Use Topics  . Smoking status: Never Smoker  . Smokeless tobacco: Never Used  . Alcohol use No    FAMILY HISTORY:  Family History  Problem Relation Age of Onset  . Cancer Unknown        unsure details - mother adopted, no contact with father  . Diabetes Mother   . Hypertension Mother   . Heart failure Mother        CHF  . Diabetes Father   . Hypertension Father   . CAD Father        MI  . CAD Brother 7       MI x4  . Stroke Neg Hx     DRUG ALLERGIES:  Allergies  Allergen Reactions  . Penicillins Rash    Has patient had a  PCN reaction causing immediate rash, facial/tongue/throat swelling, SOB or lightheadedness with hypotension: No Has patient had a PCN reaction causing severe rash involving mucus membranes or skin necrosis: No Has patient had a PCN reaction that required hospitalization: No Has patient had a PCN reaction occurring within the last 10 years: No If all of the above answers are "NO", then may proceed with Cephalosporin use.     REVIEW OF SYSTEMS:   CONSTITUTIONAL: No fever, fatigue or weakness.  EYES: No blurred or double vision.  EARS, NOSE, AND THROAT: No tinnitus or ear pain.  RESPIRATORY: No cough, shortness of breath, wheezing or hemoptysis.  CARDIOVASCULAR: No chest pain, orthopnea, edema.  GASTROINTESTINAL: No nausea, vomiting, diarrhea or abdominal pain.  GENITOURINARY: No dysuria, hematuria.  ENDOCRINE: No polyuria, nocturia,  HEMATOLOGY: No anemia, easy bruising or bleeding SKIN: No rash or lesion. MUSCULOSKELETAL: right elbow joint pain or arthritis.   NEUROLOGIC: No tingling, numbness, weakness.  PSYCHIATRY: No anxiety or depression.   MEDICATIONS AT HOME:  Prior to Admission medications   Medication Sig Start Date End Date Taking? Authorizing Provider  acetaminophen (TYLENOL) 325 MG tablet Take 650 mg by mouth every 6 (six) hours as needed.   Yes [provider]  apixaban [facility])  5 MG TABS tablet Take 1 tablet (5 mg total) by mouth 2 (two) times daily. Patient taking differently: Take 10 mg by mouth 2 (two) times daily.  03/05/17  Yes Cook, Jayce G, DO  empagliflozin (JARDIANCE) 25 MG TABS tablet Take 25 mg by mouth daily. 03/06/17  Yes Cook, Jayce G, DO  gabapentin (NEURONTIN) 300 MG capsule Take 1 capsule (300 mg total) by mouth 3 (three) times daily. 01/16/17  Yes Cook, Jayce G, DO  metFORMIN (GLUCOPHAGE) 1000 MG tablet TAKE 1 TABLET WITH MEALS TWICE A DAY FOR 90 DAYS 02/11/17  Yes Cook, Jayce G, DO  Blood Glucose Monitoring Suppl (ONE TOUCH ULTRA SYSTEM KIT)  W/DEVICE KIT 1 kit by Does not apply route once. 07/06/14   Paul Bush, MD  glucose blood test strip Use as instructed to check sugar once daily or as needed. Dx: E11.40 03/05/17   [name], DO  Lancets [facility] ULTRASOFT) lancets Use as instructed to check sugar once daily or as needed. Dx: E11.40 07/06/14   Paul Bush, MD      PHYSICAL EXAMINATION:   VITAL SIGNS: Blood pressure 135/82, pulse 94, temperature (!) 96 F (35.6 C), temperature source Oral, resp. rate 16, height 6' (1.829 m), weight 109.3 kg (241 lb), SpO2 95 %.  GENERAL:  46 y.o.-year-old patient lying in the bed with no acute distress.  EYES: Pupils equal, round, reactive to light and accommodation. No scleral icterus. Extraocular muscles intact.  HEENT: Head atraumatic, normocephalic. Oropharynx and nasopharynx clear.  NECK:  Supple, no jugular venous distention. No thyroid enlargement, no tenderness.  LUNGS: Normal breath sounds bilaterally, no wheezing, rales,rhonchi or crepitation. No use of accessory muscles of respiration.  CARDIOVASCULAR: S1, S2 normal. No murmurs, rubs, or gallops.  ABDOMEN: Soft, nontender, nondistended. Bowel sounds present. No organomegaly or mass.  EXTREMITIES: No pedal edema, cyanosis, or clubbing. Have swelling on right elbow extending from 2-3 inches above the joint till lower part of fore arm, pulses present, able to move fingers, but elbow movements are limited due to pain. NEUROLOGIC: Cranial nerves II through XII are intact. Muscle strength 5/5 in all extremities. Sensation intact. Gait not checked.  PSYCHIATRIC: The patient is alert and oriented x 3.  SKIN: No obvious rash, lesion, or ulcer.   LABORATORY PANEL:   CBC  Recent Labs Lab 04/22/17 1033  WBC 14.0*  HGB 16.0  HCT 46.5  PLT 198  MCV 91.0  MCH 31.3  MCHC 34.3  RDW 12.9   ------------------------------------------------------------------------------------------------------------------  Chemistries    Recent Labs Lab 04/22/17 1033  NA 136  K 4.1  CL 101  CO2 26  GLUCOSE 185*  BUN 13  CREATININE 0.91  CALCIUM 9.7  AST 26  ALT 32  ALKPHOS 73  BILITOT 0.7   ------------------------------------------------------------------------------------------------------------------ estimated creatinine clearance is 128.2 mL/min (by C-G formula based on SCr of 0.91 mg/dL). ------------------------------------------------------------------------------------------------------------------ No results for input(s): TSH, T4TOTAL, T3FREE, THYROIDAB in the last 72 hours.  Invalid input(s): FREET3   Coagulation profile No results for input(s): INR, PROTIME in the last 168 hours. ------------------------------------------------------------------------------------------------------------------- No results for input(s): DDIMER in the last 72 hours. -------------------------------------------------------------------------------------------------------------------  Cardiac Enzymes No results for input(s): CKMB, TROPONINI, MYOGLOBIN in the last 168 hours.  Invalid input(s): CK ------------------------------------------------------------------------------------------------------------------ Invalid input(s): POCBNP  ---------------------------------------------------------------------------------------------------------------  Urinalysis    Component Value Date/Time   BILIRUBINUR negative 01/11/2017 1628   PROTEINUR negative 01/11/2017 1628   UROBILINOGEN 0.2 01/11/2017 1628   NITRITE negative 01/11/2017 1628   LEUKOCYTESUR Negative  01/11/2017 1628     RADIOLOGY: Dg Elbow Complete Right  Result Date: 04/22/2017 CLINICAL DATA:  Infection/swelling. Pain and redness in the right elbow since Thursday, progressing. EXAM: RIGHT ELBOW - COMPLETE 3+ VIEW COMPARISON:  None. FINDINGS: Prominent soft tissue swelling about the medial and posterior elbow. No fracture or erosion. Negative for joint  effusion. No soft tissue emphysema. IMPRESSION: Soft tissue swelling in keeping with history of skin infection. No soft tissue emphysema or opaque foreign body. No osseous findings. Electronically Signed   By: Monte Fantasia M.D.   On: 04/22/2017 12:46    EKG: No orders found for this or any previous visit.  IMPRESSION AND PLAN:  * Right elbow cellulitis    Likely joint infection    IV vanc + Zosyn- tolerated in ER.   MRSA screen.    Ortho consult  * DM   Cont metformin    Keep on ISS.  * PE    On eliquis.  All the records are reviewed and case discussed with ED provider. Management plans discussed with the patient, family and they are in agreement.  CODE STATUS: Full. Code Status History    This patient does not have a recorded code status. Please follow your organizational policy for patients in this situation.       TOTAL TIME TAKING CARE OF THIS PATIENT: 50 minutes.    Bensinger Basta M.D on 04/22/2017   Between 7am to 6pm - Pager - [phone]  After 6pm go to [email or web address] - password [email or web address] [facility]  Office  [phone]  CC: Primary care physician; [name], DO   Note: This dictation was prepared with Dragon dictation along with smaller phrase technology. Any transcriptional errors that result from this process are unintentional.

## 2017-04-22 NOTE — ED Notes (Signed)
First nurse note: Pt sent from [facility] for evaluation of possible septic joint in right elbow. Pt has history of type 2 diabetes.

## 2017-04-22 NOTE — Progress Notes (Signed)
Pharmacy Antibiotic Note  Paul Keller is a 46 y.o. male admitted on 04/22/2017 with elbow cellulitis, possible joint infection per notes.  Pharmacy has been consulted for piperacillin/tazobactam and vancomycin dosing.  Discussed antibiotic allergies with MD. Per MD, patient tolerated a dose of piperacillin/tazobactam in the ED and would like to continue with piperacillin/tazobactam and vancomycin. Spoke with RN while in patient room - patient denied any itching/signs of reaction.  Plan: Piperacillin/tazobactam 3.375 g IV q8h EI  Vancomycin 1000 mg dose given in ED. Will order vancomycin 1500 mg IV q12h (6 hour stacked dose) Goal VT 15-20 mcg/mL given concern for joint infection VT to be obtained prior to 5th dose which should represent steady state. Patient is at risk of accumulation due to obesity.  Kinetics: Using adjusted body weight = 90 kg, CrCl 100 mL/min Ke: 0.087 Half-life: 8 hrs Vd: 63 L Cmin (estimated) ~16 mcg/mL  Height: 6' (182.9 cm) Weight: 241 lb (109.3 kg) IBW/kg (Calculated) : 77.6  Temp (24hrs), Avg:97 F (36.1 C), Min:96 F (35.6 C), Max:98 F (36.7 C)   Recent Labs Lab 04/22/17 1033 04/22/17 1034  WBC 14.0*  --   CREATININE 0.91  --   LATICACIDVEN  --  1.7    Estimated Creatinine Clearance: 128.2 mL/min (by C-G formula based on SCr of 0.91 mg/dL).    Allergies  Allergen Reactions  . Penicillins Rash    Has patient had a PCN reaction causing immediate rash, facial/tongue/throat swelling, SOB or lightheadedness with hypotension: No Has patient had a PCN reaction causing severe rash involving mucus membranes or skin necrosis: No Has patient had a PCN reaction that required hospitalization: No Has patient had a PCN reaction occurring within the last 10 years: No If all of the above answers are "NO", then may proceed with Cephalosporin use.    Antimicrobials this admission: Piperacillin/tazobactam 9/17 >>  vancomycin 9/17 >>   Dose adjustments  this admission:  Microbiology results: 9/17 BCx: Sent 9/17 MRSA PCR: Sent  Thank you for allowing pharmacy to be a part of this patient's care.  Cindi Carbon, PharmD Clinical Pharmacist 04/22/2017 3:47 PM

## 2017-04-23 LAB — GLUCOSE, CAPILLARY
GLUCOSE-CAPILLARY: 173 mg/dL — AB (ref 65–99)
GLUCOSE-CAPILLARY: 168 mg/dL — AB (ref 65–99)
GLUCOSE-CAPILLARY: 203 mg/dL — AB (ref 65–99)
GLUCOSE-CAPILLARY: 200 mg/dL — AB (ref 65–99)

## 2017-04-23 LAB — CBC
HCT: 43.6 % (ref 40.0–52.0)
HEMOGLOBIN: 15.3 g/dL (ref 13.0–18.0)
MCH: 31.6 pg (ref 26.0–34.0)
MCHC: 35 g/dL (ref 32.0–36.0)
MCV: 90.1 fL (ref 80.0–100.0)
Platelets: 193 10*3/uL (ref 150–440)
RBC: 4.84 MIL/uL (ref 4.40–5.90)
RDW: 12.4 % (ref 11.5–14.5)
WBC: 11.1 10*3/uL — ABNORMAL HIGH (ref 3.8–10.6)

## 2017-04-23 LAB — BASIC METABOLIC PANEL
ANION GAP: 6 (ref 5–15)
BUN: 12 mg/dL (ref 6–20)
CALCIUM: 9 mg/dL (ref 8.9–10.3)
CO2: 28 mmol/L (ref 22–32)
Chloride: 103 mmol/L (ref 101–111)
Creatinine, Ser: 0.71 mg/dL (ref 0.61–1.24)
Glucose, Bld: 168 mg/dL — ABNORMAL HIGH (ref 65–99)
Potassium: 4 mmol/L (ref 3.5–5.1)
Sodium: 137 mmol/L (ref 135–145)

## 2017-04-23 LAB — MRSA PCR SCREENING: MRSA BY PCR: NEGATIVE

## 2017-04-23 LAB — HIV ANTIBODY (ROUTINE TESTING W REFLEX): HIV Screen 4th Generation wRfx: NONREACTIVE

## 2017-04-23 MED ORDER — CEFAZOLIN SODIUM-DEXTROSE 1-4 GM/50ML-% IV SOLN
1.0000 g | Freq: Three times a day (TID) | INTRAVENOUS | Status: DC
  Administered 2017-04-23 – 2017-04-24 (×3): 1 g via INTRAVENOUS
  Filled 2017-04-23 (×5): qty 50

## 2017-04-23 MED ORDER — EMPAGLIFLOZIN 25 MG PO TABS
25.0000 mg | ORAL_TABLET | Freq: Every day | ORAL | Status: DC
  Administered 2017-04-24 – 2017-04-25 (×2): 25 mg via ORAL
  Filled 2017-04-23 (×2): qty 25

## 2017-04-23 MED ORDER — PREDNISONE 20 MG PO TABS
20.0000 mg | ORAL_TABLET | Freq: Every day | ORAL | Status: DC
  Administered 2017-04-23 – 2017-04-24 (×2): 20 mg via ORAL
  Filled 2017-04-23 (×2): qty 1

## 2017-04-23 MED ORDER — INSULIN ASPART 100 UNIT/ML ~~LOC~~ SOLN
0.0000 [IU] | Freq: Every day | SUBCUTANEOUS | Status: DC
  Administered 2017-04-24: 2 [IU] via SUBCUTANEOUS
  Filled 2017-04-23: qty 1

## 2017-04-23 MED ORDER — CANAGLIFLOZIN 100 MG PO TABS
100.0000 mg | ORAL_TABLET | Freq: Every day | ORAL | Status: DC
  Filled 2017-04-23: qty 1

## 2017-04-23 NOTE — Progress Notes (Addendum)
Patient ID: Paul Keller, male   DOB: May 31, 1971, 46 y.o.   MRN: [id number]  [facility] PROGRESS NOTE  Paul Keller:[id number] DOB: 1971-08-02 DOA: 04/22/2017 PCP: Tommie Sams, DO  HPI/Subjective: Patient with 12 out of 10 pain in his right elbow when he bends it or if you touch it. Not too much pain if it is held still.  Objective: Vitals:   04/22/17 2056 04/23/17 0830  BP: 130/65 119/73  Pulse: 84 91  Resp: 18 18  Temp: 98.1 F (36.7 C) 98.5 F (36.9 C)  SpO2: 94% 94%    Filed Weights   04/22/17 1008  Weight: 109.3 kg (241 lb)    ROS: Review of Systems  Constitutional: Negative for chills and fever.  Eyes: Negative for blurred vision.  Respiratory: Negative for cough and shortness of breath.   Cardiovascular: Negative for chest pain.  Gastrointestinal: Negative for abdominal pain, constipation, diarrhea, nausea and vomiting.  Genitourinary: Negative for dysuria.  Musculoskeletal: Positive for neck pain. Negative for joint pain.  Neurological: Negative for dizziness and headaches.   Exam: Physical Exam  HENT:  Nose: No mucosal edema.  Mouth/Throat: No oropharyngeal exudate or posterior oropharyngeal edema.  Eyes: Pupils are equal, round, and reactive to light. Conjunctivae, EOM and lids are normal.  Neck: No JVD present. Carotid bruit is not present. No edema present. No thyroid mass and no thyromegaly present.  Cardiovascular: S1 normal and S2 normal.  Exam reveals no gallop.   No murmur heard. Pulses:      Dorsalis pedis pulses are 2+ on the right side, and 2+ on the left side.  Respiratory: No respiratory distress. He has no wheezes. He has no rhonchi. He has no rales.  GI: Soft. Bowel sounds are normal. There is no tenderness.  Musculoskeletal:       Right elbow: He exhibits decreased range of motion and swelling.  Lymphadenopathy:    He has no cervical adenopathy.  Neurological: He is alert. No cranial nerve deficit.  Skin: Skin is  warm. Nails show no clubbing.  Erythema still present around right elbow  Psychiatric: He has a normal mood and affect.      Data Reviewed: Basic Metabolic Panel:  Recent Labs Lab 04/22/17 1033 04/23/17 0549  NA 136 137  K 4.1 4.0  CL 101 103  CO2 26 28  GLUCOSE 185* 168*  BUN 13 12  CREATININE 0.91 0.71  CALCIUM 9.7 9.0   Liver Function Tests:  Recent Labs Lab 04/22/17 1033  AST 26  ALT 32  ALKPHOS 73  BILITOT 0.7  PROT 8.5*  ALBUMIN 4.0   CBC:  Recent Labs Lab 04/22/17 1033 04/23/17 0549  WBC 14.0* 11.1*  HGB 16.0 15.3  HCT 46.5 43.6  MCV 91.0 90.1  PLT 198 193   CBG:  Recent Labs Lab 04/22/17 1827 04/22/17 2140 04/23/17 0739 04/23/17 1133  GLUCAP 219* 195* 173* 168*    Recent Results (from the past 240 hour(s))  Blood culture (routine x 2)     Status: None (Preliminary result)   Collection Time: 04/22/17 12:52 PM  Result Value Ref Range Status   Specimen Description BLOOD BLOOD LEFT FOREARM  Final   Special Requests   Final    BOTTLES DRAWN AEROBIC AND ANAEROBIC Blood Culture results may not be optimal due to an excessive volume of blood received in culture bottles   Culture NO GROWTH < 24 HOURS  Final   Report Status PENDING  Incomplete  Blood culture (routine x 2)     Status: None (Preliminary result)   Collection Time: 04/22/17 12:52 PM  Result Value Ref Range Status   Specimen Description BLOOD BLOOD LEFT HAND  Final   Special Requests   Final    BOTTLES DRAWN AEROBIC AND ANAEROBIC Blood Culture adequate volume   Culture NO GROWTH < 24 HOURS  Final   Report Status PENDING  Incomplete     Studies: Dg Elbow Complete Right  Result Date: 04/22/2017 CLINICAL DATA:  Infection/swelling. Pain and redness in the right elbow since Thursday, progressing. EXAM: RIGHT ELBOW - COMPLETE 3+ VIEW COMPARISON:  None. FINDINGS: Prominent soft tissue swelling about the medial and posterior elbow. No fracture or erosion. Negative for joint effusion. No  soft tissue emphysema. IMPRESSION: Soft tissue swelling in keeping with history of skin infection. No soft tissue emphysema or opaque foreign body. No osseous findings. Electronically Signed   By: Marnee Spring M.D.   On: 04/22/2017 12:46    Scheduled Meds: . apixaban  5 mg Oral BID  . canagliflozin  100 mg Oral QAC breakfast  . docusate sodium  100 mg Oral BID  . gabapentin  300 mg Oral TID  . insulin aspart  0-5 Units Subcutaneous QHS  . insulin aspart  0-9 Units Subcutaneous TID WC  . metFORMIN  500 mg Oral BID WC  . predniSONE  20 mg Oral Q breakfast   Continuous Infusions: . piperacillin-tazobactam (ZOSYN)  IV 3.375 g (04/23/17 1000)  . vancomycin Stopped (04/23/17 1100)    Assessment/Plan:  1. Right olecranon bursitis with infection With leukocytosis. On aggressive antibiotics with vancomycin and Zosyn. Case discussed with pharmacist to switch antibiotics over to Ancef.  Start a low-dose anti-inflammatory with prednisone. With severe pain unable to send him home today. Oral pain medications when necessary 2. History of pulmonary embolism on Eliquis 3. Type 2 diabetes mellitus on metformin and jardiance. Insulin sliding scale.   Code Status:     Code Status Orders        Start     Ordered   04/22/17 1535  Full code  Continuous     04/22/17 1534    Code Status History    Date Active Date Inactive Code Status Order ID Comments User Context   This patient has a current code status but no historical code status.     Disposition Plan: evaluate daily on disposition  Consultants:  Orthopedic surgery  Antibiotics:  Vancomycin  Zosyn  Time spent: 28 minutes  [name]  [employer]

## 2017-04-23 NOTE — Progress Notes (Signed)
Subjective:  Less pain than yesterday.  No fever symptoms       Patient reports pain as none.  Objective: Less reddness and swelling since yesterday.  ROM of elbow joint remains normal.  csm good distally.  VITALS:   Vitals:   04/23/17 1551 04/23/17 2043  BP: 129/66 122/68  Pulse: 83 97  Resp:  18  Temp: 98.6 F (37 C) 98.5 F (36.9 C)  SpO2: 93% 95%    Neurologically intact ABD soft Neurovascular intact Sensation intact distally Intact pulses distally Dorsiflexion/Plantar flexion intact  LABS  Recent Labs  04/22/17 1033 04/23/17 0549  HGB 16.0 15.3  HCT 46.5 43.6  WBC 14.0* 11.1*  PLT 198 193     Recent Labs  04/22/17 1033 04/23/17 0549  NA 136 137  K 4.1 4.0  BUN 13 12  CREATININE 0.91 0.71  GLUCOSE 185* 168*    No results for input(s): LABPT, INR in the last 72 hours.   Assessment/Plan: Improving.  Continue iv antibiotics to tomorrow and then switch to po

## 2017-04-23 NOTE — Progress Notes (Signed)
Per pharmacy, Theodis Sato is not available. Pt was asked to bring his own med (jardiance) from home. Pt agreed to ask family to bring it.    Stephannie Peters, RN

## 2017-04-24 LAB — GLUCOSE, CAPILLARY
GLUCOSE-CAPILLARY: 221 mg/dL — AB (ref 65–99)
GLUCOSE-CAPILLARY: 194 mg/dL — AB (ref 65–99)
Glucose-Capillary: 143 mg/dL — ABNORMAL HIGH (ref 65–99)
Glucose-Capillary: 222 mg/dL — ABNORMAL HIGH (ref 65–99)

## 2017-04-24 MED ORDER — METFORMIN HCL 500 MG PO TABS
1000.0000 mg | ORAL_TABLET | Freq: Two times a day (BID) | ORAL | Status: DC
  Administered 2017-04-24 – 2017-04-25 (×2): 1000 mg via ORAL
  Filled 2017-04-24 (×3): qty 2

## 2017-04-24 MED ORDER — VANCOMYCIN HCL 10 G IV SOLR
1500.0000 mg | Freq: Two times a day (BID) | INTRAVENOUS | Status: DC
  Administered 2017-04-24 – 2017-04-25 (×3): 1500 mg via INTRAVENOUS
  Filled 2017-04-24 (×4): qty 1500

## 2017-04-24 MED ORDER — OXYCODONE HCL 5 MG PO TABS
10.0000 mg | ORAL_TABLET | ORAL | Status: DC | PRN
  Administered 2017-04-24 – 2017-04-25 (×5): 10 mg via ORAL
  Filled 2017-04-24 (×5): qty 2

## 2017-04-24 MED ORDER — MELOXICAM 15 MG PO TABS
15.0000 mg | ORAL_TABLET | Freq: Every day | ORAL | Status: DC
  Administered 2017-04-25: 15 mg via ORAL
  Filled 2017-04-24: qty 1

## 2017-04-24 MED ORDER — SODIUM CHLORIDE 0.9 % IV SOLN
3.0000 g | Freq: Four times a day (QID) | INTRAVENOUS | Status: DC
  Administered 2017-04-24 – 2017-04-25 (×6): 3 g via INTRAVENOUS
  Filled 2017-04-24 (×7): qty 3

## 2017-04-24 NOTE — Progress Notes (Signed)
Pharmacy Antibiotic Note  Paul Keller is a 46 y.o. male admitted on 04/22/2017 with elbow cellulitis, possible joint infection per notes.  Pharmacy has been consulted for unasyn and vancomycin dosing. Patient was de-escalated to Ancef; however conditioning hasn't continue to improve so MD Wieting wanted to broaden spectrum of antimicrobials to Vancomycin and Unasyn. Patient previously on Zosyn despite PCN allergy.   Plan: Will restart previous dose of Vancomycin @ 1500 mg IV q12 hours.  Will also start Unasyn 3 g IV q6 hours.   Height: 6' (182.9 cm) Weight: 241 lb (109.3 kg) IBW/kg (Calculated) : 77.6  Temp (24hrs), Avg:98.3 F (36.8 C), Min:97.9 F (36.6 C), Max:98.6 F (37 C)   Recent Labs Lab 04/22/17 1033 04/22/17 1034 04/22/17 1543 04/23/17 0549  WBC 14.0*  --   --  11.1*  CREATININE 0.91  --   --  0.71  LATICACIDVEN  --  1.7 1.1  --     Estimated Creatinine Clearance: 145.8 mL/min (by C-G formula based on SCr of 0.71 mg/dL).    Allergies  Allergen Reactions  . Penicillins Rash    Has patient had a PCN reaction causing immediate rash, facial/tongue/throat swelling, SOB or lightheadedness with hypotension: No Has patient had a PCN reaction causing severe rash involving mucus membranes or skin necrosis: No Has patient had a PCN reaction that required hospitalization: No Has patient had a PCN reaction occurring within the last 10 years: No If all of the above answers are "NO", then may proceed with Cephalosporin use.    Antimicrobials this admission: Piperacillin/tazobactam 9/17 >>9/18  vancomycin 9/17 >> 9/18 Ancef 9/18>>> 9/19 Vancomycin 9/19  Unasyn 9/19   Dose adjustments this admission:  Microbiology results: 9/17 BCx: Sent 9/17 MRSA PCR: Sent  Thank you for allowing pharmacy to be a part of this patient's care.  Demetrius Charity, PharmD Clinical Pharmacist 04/24/2017 10:27 AM

## 2017-04-24 NOTE — Progress Notes (Signed)
Patient ID: Paul Keller, male   DOB: 02-Feb-1971, 46 y.o.   MRN: [id number]  Patient ID: Paul Keller, male   DOB: [birth year], 46 y.o.   MRN: [id number]  [facility] PROGRESS NOTE  Paul Keller GUY:[id number] DOB: 1971-05-01 DOA: 04/22/2017 PCP: Tommie Sams, DO  HPI/Subjective: Patient was feeling better yesterday during the day but then developed severe pain last night and this morning. He is able to move his arm around better today but very painful.  Objective: Vitals:   04/23/17 2043 04/24/17 0758  BP: 122/68 125/69  Pulse: 97 81  Resp: 18 20  Temp: 98.5 F (36.9 C) 97.9 F (36.6 C)  SpO2: 95% 93%    Filed Weights   04/22/17 1008  Weight: 109.3 kg (241 lb)    ROS: Review of Systems  Constitutional: Negative for chills and fever.  Eyes: Negative for blurred vision.  Respiratory: Negative for cough and shortness of breath.   Cardiovascular: Negative for chest pain.  Gastrointestinal: Negative for abdominal pain, constipation, diarrhea, nausea and vomiting.  Genitourinary: Negative for dysuria.  Musculoskeletal: Positive for joint pain.  Neurological: Negative for dizziness and headaches.   Exam: Physical Exam  HENT:  Nose: No mucosal edema.  Mouth/Throat: No oropharyngeal exudate or posterior oropharyngeal edema.  Eyes: Pupils are equal, round, and reactive to light. Conjunctivae, EOM and lids are normal.  Neck: No JVD present. Carotid bruit is not present. No edema present. No thyroid mass and no thyromegaly present.  Cardiovascular: S1 normal and S2 normal.  Exam reveals no gallop.   No murmur heard. Pulses:      Dorsalis pedis pulses are 2+ on the right side, and 2+ on the left side.  Respiratory: No respiratory distress. He has no wheezes. He has no rhonchi. He has no rales.  GI: Soft. Bowel sounds are normal. There is no tenderness.  Musculoskeletal:       Right elbow: He exhibits decreased range of motion and swelling.  Right elbow  painful at the medial condyle.  Lymphadenopathy:    He has no cervical adenopathy.  Neurological: He is alert. No cranial nerve deficit.  Skin: Skin is warm. Nails show no clubbing.  Erythema still present around right elbow  Psychiatric: He has a normal mood and affect.      Data Reviewed: Basic Metabolic Panel:  Recent Labs Lab 04/22/17 1033 04/23/17 0549  NA 136 137  K 4.1 4.0  CL 101 103  CO2 26 28  GLUCOSE 185* 168*  BUN 13 12  CREATININE 0.91 0.71  CALCIUM 9.7 9.0   Liver Function Tests:  Recent Labs Lab 04/22/17 1033  AST 26  ALT 32  ALKPHOS 73  BILITOT 0.7  PROT 8.5*  ALBUMIN 4.0   CBC:  Recent Labs Lab 04/22/17 1033 04/23/17 0549  WBC 14.0* 11.1*  HGB 16.0 15.3  HCT 46.5 43.6  MCV 91.0 90.1  PLT 198 193   CBG:  Recent Labs Lab 04/23/17 1133 04/23/17 1647 04/23/17 2059 04/24/17 0802 04/24/17 1207  GLUCAP 168* 203* 200* 143* 221*    Recent Results (from the past 240 hour(s))  Blood culture (routine x 2)     Status: None (Preliminary result)   Collection Time: 04/22/17 12:52 PM  Result Value Ref Range Status   Specimen Description BLOOD BLOOD LEFT FOREARM  Final   Special Requests   Final    BOTTLES DRAWN AEROBIC AND ANAEROBIC Blood Culture results may not be optimal due to an  excessive volume of blood received in culture bottles   Culture NO GROWTH 2 DAYS  Final   Report Status PENDING  Incomplete  Blood culture (routine x 2)     Status: None (Preliminary result)   Collection Time: 04/22/17 12:52 PM  Result Value Ref Range Status   Specimen Description BLOOD BLOOD LEFT HAND  Final   Special Requests   Final    BOTTLES DRAWN AEROBIC AND ANAEROBIC Blood Culture adequate volume   Culture NO GROWTH 2 DAYS  Final   Report Status PENDING  Incomplete  MRSA PCR Screening     Status: None   Collection Time: 04/22/17  1:51 PM  Result Value Ref Range Status   MRSA by PCR NEGATIVE NEGATIVE Final    Comment:        The GeneXpert MRSA  Assay (FDA approved for NASAL specimens only), is one component of a comprehensive MRSA colonization surveillance program. It is not intended to diagnose MRSA infection nor to guide or monitor treatment for MRSA infections.       Scheduled Meds: . apixaban  5 mg Oral BID  . docusate sodium  100 mg Oral BID  . empagliflozin  25 mg Oral Daily  . gabapentin  300 mg Oral TID  . insulin aspart  0-5 Units Subcutaneous QHS  . insulin aspart  0-9 Units Subcutaneous TID WC  . [START ON 04/25/2017] meloxicam  15 mg Oral Daily  . metFORMIN  1,000 mg Oral BID WC   Continuous Infusions: . ampicillin-sulbactam (UNASYN) IV Stopped (04/24/17 1246)  . vancomycin 1,500 mg (04/24/17 1211)    Assessment/Plan:  1. Right olecranon bursitis with infection With leukocytosis.  Since things seem to worsen last night after I downgraded antibiotics I'll get more aggressive antibiotics with Unasyn and vancomycin today. With severe pain unable to send him home today. Oral pain medications when necessary. Reassess tomorrow. 2. History of pulmonary embolism on Eliquis 3. Type 2 diabetes mellitus on metformin and jardiance. Insulin sliding scale.   Code Status:     Code Status Orders        Start     Ordered   04/22/17 1535  Full code  Continuous     04/22/17 1534    Code Status History    Date Active Date Inactive Code Status Order ID Comments User Context   This patient has a current code status but no historical code status.     Disposition Plan: evaluate daily on disposition  Consultants:  Orthopedic surgery  Antibiotics:  Vancomycin  Unasyn  Time spent: 26 minutes  [name]  [employer]

## 2017-04-25 ENCOUNTER — Telehealth: Payer: Self-pay | Admitting: Family Medicine

## 2017-04-25 LAB — GLUCOSE, CAPILLARY
GLUCOSE-CAPILLARY: 167 mg/dL — AB (ref 65–99)
GLUCOSE-CAPILLARY: 167 mg/dL — AB (ref 65–99)
Glucose-Capillary: 179 mg/dL — ABNORMAL HIGH (ref 65–99)

## 2017-04-25 MED ORDER — OXYCODONE HCL 10 MG PO TABS: 10.0000 mg | ORAL_TABLET | ORAL | 0 refills | Status: DC | PRN

## 2017-04-25 MED ORDER — SULFAMETHOXAZOLE-TRIMETHOPRIM 800-160 MG PO TABS: 1.0000 | ORAL_TABLET | Freq: Two times a day (BID) | ORAL | 0 refills | Status: DC

## 2017-04-25 MED ORDER — MELOXICAM 15 MG PO TABS: 15.0000 mg | ORAL_TABLET | Freq: Every day | ORAL | 0 refills | Status: DC

## 2017-04-25 MED ORDER — SULFAMETHOXAZOLE-TRIMETHOPRIM 800-160 MG PO TABS: 1.0000 | ORAL_TABLET | Freq: Two times a day (BID) | ORAL | Status: DC

## 2017-04-25 NOTE — Telephone Encounter (Signed)
Velna Hatchet from [facility] called and needed to schedule a HFU for pt. He was in for arthritis in rt elbow. Scheduled pt with M. Arnett on 9/27 :30. Please advise, thank you!

## 2017-04-25 NOTE — Progress Notes (Signed)
Patient ID: Paul Keller, male   DOB: [birth year], 46 y.o.   MRN: [id number] [facility] - [place] at [facility]        Paul Keller was admitted to the Hospital on 04/22/2017 and Discharged  04/25/2017 and should be excused from work/school   for 6 days starting 04/22/2017 , may return to work/school without any restrictions.  [name] M.D on 04/25/2017,at 10:33 AM  [facility] - [place] at [facility]  [phone]

## 2017-04-25 NOTE — Discharge Summary (Signed)
[place] [facility] [place] NAME: Paul Keller    MR#:  [id number]  DATE OF BIRTH:  May 16, 1971  DATE OF ADMISSION:  04/22/2017 ADMITTING PHYSICIAN: Waybright Basta, MD  DATE OF DISCHARGE: 04/25/2017  PRIMARY CARE PHYSICIAN: [name], DO    ADMISSION DIAGNOSIS:  Pyogenic arthritis of right elbow, due to unspecified organism ([place]) [M00.9]  DISCHARGE DIAGNOSIS:  Principal Problem:   Cellulitis   SECONDARY DIAGNOSIS:   Past Medical History:  Diagnosis Date  . Diabetes ([place])   . Pulmonary emboli ([place]) 11/08/2016   2 clots in lungs and left leg per pt  . Type II or unspecified type diabetes mellitus without mention of complication, uncontrolled 2008   completed DSME    HOSPITAL COURSE:   1. Olecranon bursitis and cellulitis with leukocytosis. Patient was seen by orthopedic surgery and they believe this was not a joint infection. The patient had swelling and erythema around the olecranon bursa with surrounding cellulitis. The patient was given initially vancomycin and Zosyn. Since the patient improved I did try to switch the patient over to Ancef and then it worsened overnight. I switched the patient back to vancomycin and Unasyn and it got better on the following day. I will send the patient home on by mouth Bactrim. Meloxicam prescribed for inflammation. Follow-up with orthopedic surgery and PMD as outpatient. 2. History of pulmonary embolism on Eliquis 3. Type 2 diabetes mellitus on metformin and Jardiance.   DISCHARGE CONDITIONS:   Satisfactory  CONSULTS OBTAINED:  Treatment Team:  Earnestine Leys, MD  DRUG ALLERGIES:   Allergies  Allergen Reactions  . Penicillins Rash    Has patient had a PCN reaction causing immediate rash, facial/tongue/throat swelling, SOB or lightheadedness with hypotension: No Has patient had a PCN reaction causing severe rash involving mucus membranes or skin necrosis: No Has patient had a PCN  reaction that required hospitalization: No Has patient had a PCN reaction occurring within the last 10 years: No If all of the above answers are "NO", then may proceed with Cephalosporin use.     DISCHARGE MEDICATIONS:   Current Discharge Medication List    START taking these medications   Details  meloxicam (MOBIC) 15 MG tablet Take 1 tablet (15 mg total) by mouth daily. Qty: 30 tablet, Refills: 0    oxyCODONE 10 MG TABS Take 1 tablet (10 mg total) by mouth every 4 (four) hours as needed for moderate pain or breakthrough pain. Qty: 15 tablet, Refills: 0    sulfamethoxazole-trimethoprim (BACTRIM DS,SEPTRA DS) 800-160 MG tablet Take 1 tablet by mouth every 12 (twelve) hours. Qty: 14 tablet, Refills: 0      CONTINUE these medications which have NOT CHANGED   Details  acetaminophen (TYLENOL) 325 MG tablet Take 650 mg by mouth every 6 (six) hours as needed.    apixaban (ELIQUIS) 5 MG TABS tablet Take 1 tablet (5 mg total) by mouth 2 (two) times daily. Qty: 60 tablet, Refills: 3    empagliflozin (JARDIANCE) 25 MG TABS tablet Take 25 mg by mouth daily. Qty: 90 tablet, Refills: 1    gabapentin (NEURONTIN) 300 MG capsule Take 1 capsule (300 mg total) by mouth 3 (three) times daily. Qty: 90 capsule, Refills: 3    metFORMIN (GLUCOPHAGE) 1000 MG tablet TAKE 1 TABLET WITH MEALS TWICE A DAY FOR 90 DAYS Qty: 180 tablet, Refills: 0    Blood Glucose Monitoring Suppl (ONE TOUCH ULTRA SYSTEM KIT) W/DEVICE KIT 1 kit by Does  not apply route once. Qty: 1 each, Refills: 0    glucose blood test strip Use as instructed to check sugar once daily or as needed. Dx: E11.40 Qty: 100 each, Refills: 3    Lancets (ONETOUCH ULTRASOFT) lancets Use as instructed to check sugar once daily or as needed. Dx: E11.40 Qty: 100 each, Refills: 3         DISCHARGE INSTRUCTIONS:   Follow-up PMD one week, follow-up orthopedic surgery one week  If you experience worsening of your admission symptoms, develop  shortness of breath, life threatening emergency, suicidal or homicidal thoughts you must seek medical attention immediately by calling 911 or calling your MD immediately  if symptoms less severe.  You Must read complete instructions/literature along with all the possible adverse reactions/side effects for all the Medicines you take and that have been prescribed to you. Take any new Medicines after you have completely understood and accept all the possible adverse reactions/side effects.   Please note  You were cared for by a hospitalist during your hospital stay. If you have any questions about your discharge medications or the care you received while you were in the hospital after you are discharged, you can call the unit and asked to speak with the hospitalist on call if the hospitalist that took care of you is not available. Once you are discharged, your primary care physician will handle any further medical issues. Please note that NO REFILLS for any discharge medications will be authorized once you are discharged, as it is imperative that you return to your primary care physician (or establish a relationship with a primary care physician if you do not have one) for your aftercare needs so that they can reassess your need for medications and monitor your lab values.    Today   CHIEF COMPLAINT:   Chief Complaint  Patient presents with  . Elbow Pain    HISTORY OF PRESENT ILLNESS:  Paul Keller  is a 46 y.o. male presented with right elbow pain   VITAL SIGNS:  Blood pressure 134/84, pulse 76, temperature 97.9 F (36.6 C), temperature source Oral, resp. rate 19, height 6' (1.829 m), weight 109.3 kg (241 lb), SpO2 98 %.    PHYSICAL EXAMINATION:  GENERAL:  46 y.o.-year-old patient lying in the bed with no acute distress.  EYES: Pupils equal, round, reactive to light and accommodation. No scleral icterus. Extraocular muscles intact.  HEENT: Head atraumatic, normocephalic. Oropharynx  and nasopharynx clear.  NECK:  Supple, no jugular venous distention. No thyroid enlargement, no tenderness.  LUNGS: Normal breath sounds bilaterally, no wheezing, rales,rhonchi or crepitation. No use of accessory muscles of respiration.  CARDIOVASCULAR: S1, S2 normal. No murmurs, rubs, or gallops.  ABDOMEN: Soft, non-tender, non-distended. Bowel sounds present. No organomegaly or mass.  EXTREMITIES: No pedal edema, cyanosis, or clubbing. Better range of motion of the right elbow with less pain. NEUROLOGIC: Cranial nerves II through XII are intact. Muscle strength 5/5 in all extremities. Sensation intact. Gait not checked.  PSYCHIATRIC: The patient is alert and oriented x 3.  SKIN: Slight erythema over the right elbow but much improved since yesterday  DATA REVIEW:   CBC  Recent Labs Lab 04/23/17 0549  WBC 11.1*  HGB 15.3  HCT 43.6  PLT 193    Chemistries   Recent Labs Lab 04/22/17 1033 04/23/17 0549  NA 136 137  K 4.1 4.0  CL 101 103  CO2 26 28  GLUCOSE 185* 168*  BUN 13 12  CREATININE  0.91 0.71  CALCIUM 9.7 9.0  AST 26  --   ALT 32  --   ALKPHOS 73  --   BILITOT 0.7  --      Microbiology Results  Results for orders placed or performed during the hospital encounter of 04/22/17  Blood culture (routine x 2)     Status: None (Preliminary result)   Collection Time: 04/22/17 12:52 PM  Result Value Ref Range Status   Specimen Description BLOOD BLOOD LEFT FOREARM  Final   Special Requests   Final    BOTTLES DRAWN AEROBIC AND ANAEROBIC Blood Culture results may not be optimal due to an excessive volume of blood received in culture bottles   Culture NO GROWTH 3 DAYS  Final   Report Status PENDING  Incomplete  Blood culture (routine x 2)     Status: None (Preliminary result)   Collection Time: 04/22/17 12:52 PM  Result Value Ref Range Status   Specimen Description BLOOD BLOOD LEFT HAND  Final   Special Requests   Final    BOTTLES DRAWN AEROBIC AND ANAEROBIC Blood  Culture adequate volume   Culture NO GROWTH 3 DAYS  Final   Report Status PENDING  Incomplete  MRSA PCR Screening     Status: None   Collection Time: 04/22/17  1:51 PM  Result Value Ref Range Status   MRSA by PCR NEGATIVE NEGATIVE Final    Comment:        The GeneXpert MRSA Assay (FDA approved for NASAL specimens only), is one component of a comprehensive MRSA colonization surveillance program. It is not intended to diagnose MRSA infection nor to guide or monitor treatment for MRSA infections.       Management plans discussed with the patient, and he is in agreement.  CODE STATUS:     Code Status Orders        Start     Ordered   04/22/17 1535  Full code  Continuous     04/22/17 1534    Code Status History    Date Active Date Inactive Code Status Order ID Comments User Context   This patient has a current code status but no historical code status.      TOTAL TIME TAKING CARE OF THIS PATIENT: 35 minutes.    Loletha Grayer M.D on 04/25/2017 at 2:31 PM  Between 7am to 6pm - Pager - [phone]  After 6pm go to [email or web address] - Proofreader  [facility] Office  [phone]  CC: Primary care physician; [name], DO

## 2017-04-26 NOTE — Telephone Encounter (Signed)
Transition Care Management Follow-up Telephone Call  How have you been since you were released from the hospital? Feeling better trying not to take pain medication , but sometimes the pain gets bad and he has to take the medication.   Do you understand why you were in the hospital?Yes   Do you understand the discharge instrcutions? Yes  Items Reviewed:  Medications reviewed: yes  Allergies reviewed: yes  Dietary changes reviewed: yes  Referrals reviewed:yes   Functional Questionnaire:   Activities of Daily Living (ADLs):   He states they are independent in the following: all ADLs. States they require assistance with the following:  No assist needed   Any transportation issues/concerns?: {no   Any patient concerns? No   Confirmed importance and date/time of follow-up visits scheduled: Yes   Confirmed with patient if condition begins to worsen call PCP or go to the ER.  Patient was given the Call-a-Nurse line [phone]: Yes.

## 2017-04-27 LAB — CULTURE, BLOOD (ROUTINE X 2)
CULTURE: NO GROWTH
Culture: NO GROWTH
SPECIAL REQUESTS: ADEQUATE

## 2017-05-02 ENCOUNTER — Ambulatory Visit: Payer: BLUE CROSS/BLUE SHIELD | Admitting: Family

## 2017-05-04 ENCOUNTER — Other Ambulatory Visit: Payer: Self-pay | Admitting: Family Medicine

## 2017-05-09 ENCOUNTER — Encounter: Payer: Self-pay | Admitting: Family

## 2017-05-09 ENCOUNTER — Ambulatory Visit (INDEPENDENT_AMBULATORY_CARE_PROVIDER_SITE_OTHER): Payer: BLUE CROSS/BLUE SHIELD | Admitting: Family

## 2017-05-09 VITALS — BP 110/68 | HR 98 | Temp 98.5°F | Ht 72.0 in | Wt 232.8 lb

## 2017-05-09 DIAGNOSIS — L03113 Cellulitis of right upper limb: Secondary | ICD-10-CM | POA: Diagnosis not present

## 2017-05-09 LAB — CBC WITH DIFFERENTIAL/PLATELET
Basophils Absolute: 0 10*3/uL (ref 0.0–0.1)
Basophils Relative: 0.5 % (ref 0.0–3.0)
EOS PCT: 1.4 % (ref 0.0–5.0)
Eosinophils Absolute: 0.2 10*3/uL (ref 0.0–0.7)
HEMATOCRIT: 46.3 % (ref 39.0–52.0)
HEMOGLOBIN: 15.3 g/dL (ref 13.0–17.0)
LYMPHS ABS: 3 10*3/uL (ref 0.7–4.0)
LYMPHS PCT: 27.4 % (ref 12.0–46.0)
MCHC: 32.9 g/dL (ref 30.0–36.0)
MCV: 92.5 fl (ref 78.0–100.0)
MONOS PCT: 13 % — AB (ref 3.0–12.0)
Monocytes Absolute: 1.4 10*3/uL — ABNORMAL HIGH (ref 0.1–1.0)
NEUTROS PCT: 57.7 % (ref 43.0–77.0)
Neutro Abs: 6.3 10*3/uL (ref 1.4–7.7)
Platelets: 263 10*3/uL (ref 150.0–400.0)
RBC: 5.01 Mil/uL (ref 4.22–5.81)
RDW: 12.8 % (ref 11.5–15.5)
WBC: 11 10*3/uL — ABNORMAL HIGH (ref 4.0–10.5)

## 2017-05-09 LAB — SEDIMENTATION RATE: SED RATE: 52 mm/h — AB (ref 0–15)

## 2017-05-09 MED ORDER — OXYCODONE HCL 10 MG PO TABS: 10.0000 mg | ORAL_TABLET | ORAL | 0 refills | Status: DC | PRN

## 2017-05-09 MED ORDER — LEVOFLOXACIN 500 MG PO TABS: 500.0000 mg | ORAL_TABLET | Freq: Every day | ORAL | 0 refills | Status: DC

## 2017-05-09 MED ORDER — DOXYCYCLINE HYCLATE 100 MG PO TABS: 100.0000 mg | ORAL_TABLET | Freq: Two times a day (BID) | ORAL | 0 refills | Status: DC

## 2017-05-09 NOTE — Progress Notes (Signed)
Subjective:    Patient ID: Paul Keller, male    DOB: Oct 26, 1970, 46 y.o.   MRN: [id number]  CC: Paul Keller is a 46 y.o. male who presents today for follow up.   HPI: CC: right elbow pain, worsened over past week;  has finished narcotics.   Overall feels swelling and heat better over past 2 weeks.   Pain controlled with oxycodone however completed course.  Some nausea from pain. Swelling seemed to improve on bactrim. On mobic.   No fever, chills, V. No wound. No prosthetic joints. Suspects a 'bite perhaps from spider' as works in [employer]. No injury or bites/wound seen.   Patient was admitted on 9/17 to 9/20 for elbow pain in concern for infected right joint. Consulted with orthopedic patient was managed with IV antibiotics- initially vancomycin, Zosyn. When switched to Ancef, it worsened. Patient was switched back to vancomycin, Unasyn and he got better the following day. ; ultimately thought cellulitis.  patient discharged on Bactrim, and meloxicam. 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 No osseous findings HISTORY:  Past Medical History:  Diagnosis Date  . Diabetes ([place])   . Pulmonary emboli ([place]) 11/08/2016   2 clots in lungs and left leg per pt  . Type II or unspecified type diabetes mellitus without mention of complication, uncontrolled 2008   completed DSME   Past Surgical History:  Procedure Laterality Date  . COLONOSCOPY WITH ESOPHAGOGASTRODUODENOSCOPY (EGD)  2013   normal-found scar tissue and removed (Dr. [name])   Family History  Problem Relation Age of Onset  . Cancer Unknown        unsure details - mother adopted, no contact with father  . Diabetes Mother   . Hypertension Mother   . Heart failure Mother        CHF  . Diabetes Father   . Hypertension Father   . CAD Father        MI  . CAD Brother 45       MI x4  . Stroke Neg Hx      Allergies: Penicillins Current Outpatient Prescriptions on File Prior to Visit  Medication Sig Dispense Refill  . acetaminophen (TYLENOL) 325 MG tablet Take 650 mg by mouth every 6 (six) hours as needed.    Marland Kitchen apixaban (ELIQUIS) 5 MG TABS tablet Take 1 tablet (5 mg total) by mouth 2 (two) times daily. (Patient taking differently: Take 10 mg by mouth 2 (two) times daily. ) 60 tablet 3  . Blood Glucose Monitoring Suppl (ONE TOUCH ULTRA SYSTEM KIT) W/DEVICE KIT 1 kit by Does not apply route once. 1 each 0  . empagliflozin (JARDIANCE) 25 MG TABS tablet Take 25 mg by mouth daily. 90 tablet 1  . gabapentin (NEURONTIN) 300 MG capsule Take 1 capsule (300 mg total) by mouth 3 (three) times daily. 90 capsule 3  . glucose blood test strip Use as instructed to check sugar once daily or as needed. Dx: E11.40 100 each 3  . Lancets (ONETOUCH ULTRASOFT) lancets Use as instructed to check sugar once daily or as needed. Dx: E11.40 100 each 3  . metFORMIN (GLUCOPHAGE) 1000 MG tablet TAKE 1 TABLET WITH MEALS TWICE A DAY FOR 90 DAYS 180 tablet 0  . sulfamethoxazole-trimethoprim (BACTRIM DS,SEPTRA DS) 800-160 MG tablet Take 1 tablet by mouth every 12 (twelve) hours. 14 tablet 0  No current facility-administered medications on file prior to visit.     Social History  Substance Use Topics  . Smoking status: Never Smoker  . Smokeless tobacco: Never Used  . Alcohol use No    Review of Systems  Constitutional: Negative for chills and fever.  Respiratory: Negative for cough.   Cardiovascular: Negative for chest pain and palpitations.  Gastrointestinal: Positive for nausea. Negative for vomiting.  Musculoskeletal: Positive for joint swelling.  Skin: Positive for color change.      Objective:    BP 110/68   Pulse 98   Temp 98.5 F (36.9 C) (Oral)   Ht 6' (1.829 m)   Wt 232 lb 12.8 oz (105.6 kg)   SpO2 96%   BMI 31.57 kg/m  BP Readings from Last 3 Encounters:  05/09/17 110/68  04/25/17 134/84   03/05/17 110/80   Wt Readings from Last 3 Encounters:  05/09/17 232 lb 12.8 oz (105.6 kg)  04/22/17 241 lb (109.3 kg)  03/05/17 242 lb 8 oz (110 kg)    Physical Exam  Constitutional: He appears well-developed and well-nourished.  Cardiovascular: Regular rhythm and normal heart sounds.   Pulmonary/Chest: Effort normal and breath sounds normal. No respiratory distress. He has no wheezes. He has no rhonchi. He has no rales.  Musculoskeletal:       Right elbow: He exhibits decreased range of motion, swelling and effusion. Tenderness found. Medial epicondyle and lateral epicondyle tenderness noted.       Right forearm: He exhibits no tenderness, no bony tenderness, no swelling, no edema and no laceration.  Guarding right elbow. Diffuse tenderness of olecranon and surrounding erythema. Increase warmth noted. No lesions, lacerations, fluctuant. Able to extend and flex, limited due to pain.   Neurological: He is alert.  Skin: Skin is warm and dry.  Psychiatric: He has a normal mood and affect. His speech is normal and behavior is normal.  Vitals reviewed.      Assessment & Plan:  1. Cellulitis of right upper extremity Afebrile. Well appearing today however obvious patient is in pain. Appears overall heat, swelling has improved however discussed at great length with patient my concern for inadequately treated infection. We agreed dual therapy with doxycycline , levaquin appropriate. Patient will maintain close follow up- one week, sooner if not improving. Seeing Dr Sabra Heck today.  Pending labs studies, blood cultures.  Return precautions given.    Case reviewed with supervising, Dr Deborra Medina, and she and I jointly agreed on management plan.   - Oxycodone HCl 10 MG TABS; Take 1 tablet (10 mg total) by mouth every 4 (four) hours as needed.  Dispense: 15 tablet; Refill: 0 - Sedimentation rate - CBC with Differential/Platelet - Blood culture (routine single) - doxycycline (VIBRA-TABS) 100  MG tablet; Take 1 tablet (100 mg total) by mouth 2 (two) times daily.  Dispense: 14 tablet; Refill: 0 - levofloxacin (LEVAQUIN) 500 MG tablet; Take 1 tablet (500 mg total) by mouth daily.  Dispense: 7 tablet; Refill: 0   I have discontinued Mr. Womac meloxicam. I have also changed his Oxycodone HCl. Additionally, I am having him start on doxycycline and levofloxacin. Lastly, I am having him maintain his [place] KIT, onetouch ultrasoft, acetaminophen, gabapentin, apixaban, glucose blood, empagliflozin, sulfamethoxazole-trimethoprim, and metFORMIN.   Meds ordered this encounter  Medications  . Oxycodone HCl 10 MG TABS    Sig: Take 1 tablet (10 mg total) by mouth every 4 (four) hours as needed.    Dispense:  15 tablet    Refill:  0    Order Specific Question:   Supervising Provider    Answer:   Deborra Medina L [[id number]]  . doxycycline (VIBRA-TABS) 100 MG tablet    Sig: Take 1 tablet (100 mg total) by mouth 2 (two) times daily.    Dispense:  14 tablet    Refill:  0    Order Specific Question:   Supervising Provider    Answer:   Deborra Medina L [[id number]]  . levofloxacin (LEVAQUIN) 500 MG tablet    Sig: Take 1 tablet (500 mg total) by mouth daily.    Dispense:  7 tablet    Refill:  0    Order Specific Question:   Supervising Provider    Answer:   Crecencio Mc [[id number]]    Return precautions given.   Risks, benefits, and alternatives of the medications and treatment plan prescribed today were discussed, and patient expressed understanding.   Education regarding symptom management and diagnosis given to patient on AVS.  Continue to follow with [name], DO for routine health maintenance.   Renelda Loma and I agreed with plan.   Mable Paris, FNP

## 2017-05-09 NOTE — Progress Notes (Signed)
Pre visit review using our clinic review tool, if applicable. No additional management support is needed unless otherwise documented below in the visit note. 

## 2017-05-09 NOTE — Patient Instructions (Addendum)
Labs today  Get rid of Mobic due to interaction with eliquis.   May take tylenol and try to stay in front of pain.  Oxycodone  Most important -Follow up one week, sooner if no improvement.

## 2017-05-15 LAB — CULTURE, BLOOD (SINGLE)
MICRO NUMBER:: 81104042
Result:: NO GROWTH
SPECIMEN QUALITY:: ADEQUATE

## 2017-05-20 ENCOUNTER — Ambulatory Visit (INDEPENDENT_AMBULATORY_CARE_PROVIDER_SITE_OTHER): Payer: BLUE CROSS/BLUE SHIELD | Admitting: Family

## 2017-05-20 VITALS — BP 102/66 | HR 95 | Temp 98.3°F | Ht 72.0 in | Wt 237.0 lb

## 2017-05-20 DIAGNOSIS — E1165 Type 2 diabetes mellitus with hyperglycemia: Secondary | ICD-10-CM

## 2017-05-20 DIAGNOSIS — L03113 Cellulitis of right upper limb: Secondary | ICD-10-CM | POA: Diagnosis not present

## 2017-05-20 DIAGNOSIS — E1140 Type 2 diabetes mellitus with diabetic neuropathy, unspecified: Secondary | ICD-10-CM | POA: Diagnosis not present

## 2017-05-20 DIAGNOSIS — IMO0002 Reserved for concepts with insufficient information to code with codable children: Secondary | ICD-10-CM

## 2017-05-20 LAB — POCT GLYCOSYLATED HEMOGLOBIN (HGB A1C): HEMOGLOBIN A1C: 8.6

## 2017-05-20 LAB — POCT GLUCOSE (DEVICE FOR HOME USE): POC GLUCOSE: 148 mg/dL — AB (ref 70–99)

## 2017-05-20 MED ORDER — OXYCODONE HCL 10 MG PO TABS: 10.0000 mg | ORAL_TABLET | ORAL | 0 refills | Status: AC | PRN

## 2017-05-20 NOTE — Assessment & Plan Note (Signed)
Improved. Will continue to stay very vigilant and let me know if starts to regress in any way. Refilled oxycodone for continued pain management however advised him to limit use and bridge with tyleonol.  I looked up patient on [place] Controlled Substances Reporting System and saw no activity that raised concern of inappropriate use.

## 2017-05-20 NOTE — Assessment & Plan Note (Signed)
Improving per patient. 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 He will focus on diet, and follow up in 3 months.

## 2017-05-20 NOTE — Progress Notes (Signed)
Pre visit review using our clinic review tool, if applicable. No additional management support is needed unless otherwise documented below in the visit note. 

## 2017-05-20 NOTE — Patient Instructions (Addendum)
Focus on diet for diabetes. Follow up in 3 months to ensure a1c continues to improve.   Let me know if elbow doesn't continue to improve.    This is  Dr. Melina Schools  example of a  "Low GI"  Diet:  It will allow you to lose 4 to 8  lbs  per month if you follow it carefully.  Your goal with exercise is a minimum of 30 minutes of aerobic exercise 5 days per week (Walking does not count once it becomes easy!)    All of the foods can be found at grocery stores and in bulk at [employer].  The Atkins protein bars and shakes are available in more varieties at Target, WalMart and Lowe's Foods.     7 AM Breakfast:  Choose from the following:  Low carbohydrate Protein  Shakes (I recommend the  Premier Protein chocolate shakes,  EAS AdvantEdge "Carb Control" shakes  Or the Atkins shakes all are under 3 net carbs)     a scrambled egg/bacon/cheese burrito made with Mission's "carb balance" whole wheat tortilla  (about 10 net carbs )  Medical laboratory scientific officer (basically a quiche without the pastry crust) that is eaten cold and very convenient way to get your eggs.  8 carbs)  If you make your own protein shakes, avoid bananas and pineapple,  And use low carb greek yogurt or original /unsweetened almond or soy milk    Avoid cereal and bananas, oatmeal and cream of wheat and grits. They are loaded with carbohydrates!   10 AM: high protein snack:  Protein bar by Atkins (the snack size, under 200 cal, usually < 6 net carbs).    A stick of cheese:  Around 1 carb,  100 cal     Dannon Light n Fit [place] Yogurt  (80 cal, 8 carbs)  Other so called "protein bars" and Greek yogurts tend to be loaded with carbohydrates.  Remember, in food advertising, the word "energy" is synonymous for " carbohydrate."  Lunch:   A Sandwich using the bread choices listed, Can use any  Eggs,  lunchmeat, grilled meat or canned tuna), avocado, regular mayo/mustard  and cheese.  A Salad using blue cheese, ranch,  Goddess or  vinagrette,  Avoid taco shells, croutons or "confetti" and no "candied nuts" but regular nuts OK.   No pretzels, nabs  or chips.  Pickles and miniature sweet peppers are a good low carb alternative that provide a "crunch"  The bread is the only source of carbohydrate in a sandwich and  can be decreased by trying some of the attached alternatives to traditional loaf bread   Avoid "Low fat dressings, as well as Reyne Dumas and [employer] Foods dressings They are loaded with sugar!   3 PM/ Mid day  Snack:  Consider  1 ounce of  almonds, walnuts, pistachios, pecans, peanuts,  Macadamia nuts or a nut medley.  Avoid "granola and granola bars "  Mixed nuts are ok in moderation as long as there are no raisins,  cranberries or dried fruit.   KIND bars are OK if you get the low glycemic index variety   Try the prosciutto/mozzarella cheese sticks by Fiorruci  In deli /backery section   High protein      6 PM  Dinner:     Meat/fowl/fish with a green salad, and either broccoli, cauliflower, green beans, spinach, brussel sprouts or  Lima beans. DO NOT BREAD THE PROTEIN!!      There  is a low carb pasta by Dreamfield's that is acceptable and tastes great: only 5 digestible carbs/serving.( All grocery stores but BJs carry it ) Several ready made meals are available low carb:   Try Michel Angelo's chicken piccata or chicken or eggplant parm over low carb pasta.(Lowes and BJs)   Clifton Custard Sanchez's "Carnitas" (pulled pork, no sauce,  0 carbs) or his beef pot roast to make a dinner burrito (at BJ's)  Pesto over low carb pasta (bj's sells a good quality pesto in the center refrigerated section of the deli   Try satueeing  Roosvelt Harps with mushroooms as a good side   Green Giant makes a mashed cauliflower that tastes like mashed potatoes  Whole wheat pasta is still full of digestible carbs and  Not as low in glycemic index as Dreamfield's.   Brown rice is still rice,  So skip the rice and noodles if you eat [place] or [place]  (or at least limit to 1/2 cup)  9 PM snack :   Breyer's "low carb" fudgsicle or  ice cream bar (Carb Smart line), or  Weight Watcher's ice cream bar , or another "no sugar added" ice cream;  a serving of fresh berries/cherries with whipped cream   Cheese or DANNON'S LlGHT N FIT GREEK YOGURT  8 ounces of [place] unsweetened almond/cococunut milk    Treat yourself to a parfait made with whipped cream blueberiies, walnuts and vanilla greek yogurt  Avoid bananas, pineapple, grapes  and watermelon on a regular basis because they are high in sugar.  THINK OF THEM AS DESSERT  Remember that snack Substitutions should be less than 10 NET carbs per serving and meals < 20 carbs. Remember to subtract fiber grams to get the "net carbs."  @

## 2017-05-20 NOTE — Progress Notes (Signed)
Subjective:    Patient ID: Paul Keller, male    DOB: [birth year], 46 y.o.   MRN: [id number]  CC: Paul Keller is a 46 y.o. male who presents today for follow up.   HPI: Follow-up from right elbow presumed cellulitis.  last visit started on doxycycline Levaquin with improvement. Still notes swelling of left elbow however no more heat, redness.   Has seen orthopedics since our last visit and again, suspect infection was outside of joint.   No fever, chills, N, V, sob. Continues to have pain and would like couple more days of pain medication. Trying to avoid leaning on the right elbow with work and when at computer.  DM- a1c 8.6, had been 9 per patient. Has seen nutrionist in the past.       HISTORY:  Past Medical History:  Diagnosis Date  . Diabetes ([place])   . Pulmonary emboli ([place]) 11/08/2016   2 clots in lungs and left leg per pt  . Type II or unspecified type diabetes mellitus without mention of complication, uncontrolled 2008   completed DSME   Past Surgical History:  Procedure Laterality Date  . COLONOSCOPY WITH ESOPHAGOGASTRODUODENOSCOPY (EGD)  2013   normal-found scar tissue and removed (Dr. [name])   Family History  Problem Relation Age of Onset  . Cancer Unknown        unsure details - mother adopted, no contact with father  . Diabetes Mother   . Hypertension Mother   . Heart failure Mother        CHF  . Diabetes Father   . Hypertension Father   . CAD Father        MI  . CAD Brother 57       MI x4  . Stroke Neg Hx     Allergies: Penicillins Current Outpatient Prescriptions on File Prior to Visit  Medication Sig Dispense Refill  . acetaminophen (TYLENOL) 325 MG tablet Take 650 mg by mouth every 6 (six) hours as needed.    Marland Kitchen apixaban (ELIQUIS) 5 MG TABS tablet Take 1 tablet (5 mg total) by mouth 2 (two) times daily. (Patient taking differently: Take 10 mg by mouth 2 (two) times daily. ) 60 tablet 3  . Blood Glucose Monitoring Suppl (ONE TOUCH  ULTRA SYSTEM KIT) W/DEVICE KIT 1 kit by Does not apply route once. 1 each 0  . empagliflozin (JARDIANCE) 25 MG TABS tablet Take 25 mg by mouth daily. 90 tablet 1  . gabapentin (NEURONTIN) 300 MG capsule Take 1 capsule (300 mg total) by mouth 3 (three) times daily. 90 capsule 3  . glucose blood test strip Use as instructed to check sugar once daily or as needed. Dx: E11.40 100 each 3  . Lancets (ONETOUCH ULTRASOFT) lancets Use as instructed to check sugar once daily or as needed. Dx: E11.40 100 each 3  . metFORMIN (GLUCOPHAGE) 1000 MG tablet TAKE 1 TABLET WITH MEALS TWICE A DAY FOR 90 DAYS 180 tablet 0  . sulfamethoxazole-trimethoprim (BACTRIM DS,SEPTRA DS) 800-160 MG tablet Take 1 tablet by mouth every 12 (twelve) hours. 14 tablet 0   No current facility-administered medications on file prior to visit.     Social History  Substance Use Topics  . Smoking status: Never Smoker  . Smokeless tobacco: Never Used  . Alcohol use No    Review of Systems  Constitutional: Negative for chills and fever.  Respiratory: Negative for cough.   Cardiovascular: Negative for chest pain and palpitations.  Gastrointestinal:  Negative for nausea and vomiting.  Musculoskeletal: Positive for joint swelling.      Objective:    BP 102/66   Pulse 95   Temp 98.3 F (36.8 C) (Oral)   Ht 6' (1.829 m)   Wt 237 lb (107.5 kg)   SpO2 94%   BMI 32.14 kg/m  BP Readings from Last 3 Encounters:  05/20/17 102/66  05/09/17 110/68  04/25/17 134/84   Wt Readings from Last 3 Encounters:  05/20/17 237 lb (107.5 kg)  05/09/17 232 lb 12.8 oz (105.6 kg)  04/22/17 241 lb (109.3 kg)    Physical Exam  Constitutional: Paul Keller appears well-developed and well-nourished.  Cardiovascular: Regular rhythm and normal heart sounds.   Pulmonary/Chest: Effort normal and breath sounds normal. No respiratory distress. Paul Keller has no wheezes. Paul Keller has no rhonchi. Paul Keller has no rales.  Musculoskeletal:       Right elbow: Paul Keller exhibits swelling. Paul Keller  exhibits normal range of motion, no effusion and no laceration. No tenderness found. No radial head tenderness noted.       Arms: Swelling noted outside of joint. No erythema or increased warmth.   Neurological: Paul Keller is alert.  Skin: Skin is warm and dry.  Psychiatric: Paul Keller has a normal mood and affect. Paul Keller speech is normal and behavior is normal.  Vitals reviewed.      Assessment & Plan:   Problem List Items Addressed This Visit      Endocrine   Type 2 diabetes mellitus, uncontrolled, with neuropathy ([place])    Improving per patient. 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       Relevant Orders   POCT HgB A1C (Completed)   POCT Glucose (Device for Home Use) (Completed)     Other   Cellulitis - Primary    Improved. Will continue to stay very vigilant and let me know if starts to regress in any way. Refilled oxycodone for continued pain management however advised Paul Keller to limit use and bridge with tyleonol.  I looked up patient on [place] Controlled Substances Reporting System and saw no activity that raised concern of inappropriate use.        Relevant Medications   Oxycodone HCl 10 MG TABS       I have discontinued Paul Keller doxycycline and levofloxacin. I am also having Paul Keller maintain Paul Keller [place] KIT, onetouch ultrasoft, acetaminophen, gabapentin, apixaban, glucose blood, empagliflozin, sulfamethoxazole-trimethoprim, metFORMIN, and Oxycodone HCl.   Meds ordered this encounter  Medications  . Oxycodone HCl 10 MG TABS    Sig: Take 1 tablet (10 mg total) by mouth every 4 (four) hours as needed.    Dispense:  15 tablet    Refill:  0    Order Specific Question:   Supervising Provider    Answer:   Crecencio Mc [[id number]]    Return precautions given.   Risks, benefits, and alternatives of the medications and treatment plan prescribed today were discussed, and patient expressed  understanding.   Education regarding symptom management and diagnosis given to patient on AVS.  Continue to follow with [name], DO for routine health maintenance.   Renelda Loma and I agreed with plan.   Mable Paris, FNP

## 2017-05-21 ENCOUNTER — Other Ambulatory Visit: Payer: Self-pay | Admitting: Family Medicine

## 2017-05-21 NOTE — Telephone Encounter (Signed)
Please advise, thanks you saw him yesterday for a OV

## 2017-05-22 ENCOUNTER — Telehealth: Payer: Self-pay | Admitting: Family

## 2017-05-22 NOTE — Telephone Encounter (Signed)
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   How often?  May refill once have more info

## 2017-05-23 MED ORDER — GABAPENTIN 300 MG PO CAPS: 300.0000 mg | ORAL_CAPSULE | Freq: Three times a day (TID) | ORAL | 3 refills | Status: AC

## 2017-05-23 NOTE — Telephone Encounter (Signed)
Spoke with patient takes gabapentin 1 tid , doesn't make him drowsy.   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 Taking oxycodone q hs as needed for arm pain.

## 2017-05-23 NOTE — Telephone Encounter (Signed)
May refill gabapentin. Please caution with using during the day

## 2017-05-30 ENCOUNTER — Inpatient Hospital Stay: Payer: BLUE CROSS/BLUE SHIELD | Admitting: Oncology

## 2017-06-05 ENCOUNTER — Telehealth: Payer: Self-pay | Admitting: Family

## 2017-06-05 NOTE — Telephone Encounter (Signed)
Call pt  Have paperwork from home care nurse, Sheryn BisonMichelle Ussery  Is it okay to send records to her? Does he still do home care?   We need is verbal okay as she is requesting medical record info

## 2017-06-07 NOTE — Telephone Encounter (Signed)
Left message for patient to return call back.  

## 2017-06-13 ENCOUNTER — Inpatient Hospital Stay: Payer: BLUE CROSS/BLUE SHIELD | Admitting: Oncology

## 2017-06-14 ENCOUNTER — Inpatient Hospital Stay: Payer: BLUE CROSS/BLUE SHIELD | Attending: Oncology | Admitting: Oncology

## 2017-06-14 ENCOUNTER — Encounter: Payer: Self-pay | Admitting: Oncology

## 2017-06-14 VITALS — BP 103/70 | HR 103 | Temp 98.0°F | Resp 14 | Wt 239.0 lb

## 2017-06-14 DIAGNOSIS — J9811 Atelectasis: Secondary | ICD-10-CM | POA: Diagnosis not present

## 2017-06-14 DIAGNOSIS — I071 Rheumatic tricuspid insufficiency: Secondary | ICD-10-CM | POA: Insufficient documentation

## 2017-06-14 DIAGNOSIS — R51 Headache: Secondary | ICD-10-CM | POA: Diagnosis not present

## 2017-06-14 DIAGNOSIS — I2692 Saddle embolus of pulmonary artery without acute cor pulmonale: Secondary | ICD-10-CM

## 2017-06-14 DIAGNOSIS — Z7984 Long term (current) use of oral hypoglycemic drugs: Secondary | ICD-10-CM | POA: Diagnosis not present

## 2017-06-14 DIAGNOSIS — Z86711 Personal history of pulmonary embolism: Secondary | ICD-10-CM | POA: Diagnosis not present

## 2017-06-14 DIAGNOSIS — E119 Type 2 diabetes mellitus without complications: Secondary | ICD-10-CM | POA: Diagnosis not present

## 2017-06-14 DIAGNOSIS — Z86718 Personal history of other venous thrombosis and embolism: Secondary | ICD-10-CM | POA: Insufficient documentation

## 2017-06-14 DIAGNOSIS — Z79899 Other long term (current) drug therapy: Secondary | ICD-10-CM | POA: Diagnosis not present

## 2017-06-14 DIAGNOSIS — Z7901 Long term (current) use of anticoagulants: Secondary | ICD-10-CM | POA: Diagnosis not present

## 2017-06-14 DIAGNOSIS — I517 Cardiomegaly: Secondary | ICD-10-CM | POA: Diagnosis not present

## 2017-06-14 NOTE — Progress Notes (Signed)
Hematology/Oncology Consult note [facility]  [phone] [phone]  Patient Care Team: [name], DO as PCP - General (Family Medicine)   Name of the patient: Paul Keller  [id number]  [birth year]   Date of visit: 06/14/17  Diagnosis- DVT and saddle PE  Chief complaint/ Reason for visit- routine f/u of DVT and PE  Heme/Onc history: 1. Patient is a 46 year old male with known past medical history other than diabetes. No family or personal history of blood clots. On 11/14/2016 lesion passed out at his work times twice and was taken to the ER for further evaluation. After he recovered from his loss of consciousness and fell short of breath and began to hyperventilate.  2. Ultrasound of the lower extremity on 11/14/2016 showed:Acute to subacute appearing deep venous thrombosis within the left lower extremity. The thrombus is large in size and likely acute to subacute and is nearly occlusive. There is evidence of deep venous thrombosis within the left superficial femoral vein and left popliteal vein  3. CTA of the chest on 11/14/2016 showed: IMPRESSION: 1. Extensive bilateral pulmonary emboli with a saddle embolus across the bifurcation of the main pulmonary artery. Thrombus extends into the a sending and ascending branches of the bilateral pulmonary arteries, in the  lower lobe branches predominantly. 2. Mild cardiomegaly. 3. Mild dependent atelectasis in the lower lobes of both lungs.  4. Echocardiogram showed:Conclusion 1. The LV ejection fraction is normal. 2. Systolic and diastolic septal flattening consistent with RV volume and pressure overload. 3.The right ventricle is moderately dilated. 4. The right ventricular systolic function is normal. No McConnell's sign. 5. There is mild to moderate tricuspid regurgitation. 6. Right ventricular systolic pressure is elevated at 40-45 mm Hg.  5. Patient had traveled to you to about 2-3  months prior to this episode but no recent travel prior to his diagnosis. No recent surgeries. He overall felt he feels well overall and denies any unintentional weight loss. Denies any blood in stools. He is going to significant stress because of his wife's cancer. No family history of colon cancer. He is a lifetime nonsmoker. Reports occasional headaches. Su feels fatigued and somewhat winded. He has not required any oxygen   Interval history- he remains on eliquis and has had no problems such as bleeding. He does not desire hypercoagulable work up at this time  ECOG PS- 0 Pain scale- 0   Review of systems- Review of Systems  Constitutional: Negative for chills, fever, malaise/fatigue and weight loss.  HENT: Negative for congestion, ear discharge and nosebleeds.   Eyes: Negative for blurred vision.  Respiratory: Negative for cough, hemoptysis, sputum production, shortness of breath and wheezing.   Cardiovascular: Negative for chest pain, palpitations, orthopnea and claudication.  Gastrointestinal: Negative for abdominal pain, blood in stool, constipation, diarrhea, heartburn, melena, nausea and vomiting.  Genitourinary: Negative for dysuria, flank pain, frequency, hematuria and urgency.  Musculoskeletal: Negative for back pain, joint pain and myalgias.  Skin: Negative for rash.  Neurological: Negative for dizziness, tingling, focal weakness, seizures, weakness and headaches.  Endo/Heme/Allergies: Does not bruise/bleed easily.  Psychiatric/Behavioral: Negative for depression and suicidal ideas. The patient does not have insomnia.       Allergies  Allergen Reactions  . Penicillins Rash    Has patient had a PCN reaction causing immediate rash, facial/tongue/throat swelling, SOB or lightheadedness with hypotension: No Has patient had a PCN reaction causing severe rash involving mucus membranes or skin necrosis: No Has patient  had a PCN reaction that required hospitalization: No Has  patient had a PCN reaction occurring within the last 10 years: No If all of the above answers are "NO", then may proceed with Cephalosporin use.      Past Medical History:  Diagnosis Date  . Diabetes ([place])   . Pulmonary emboli ([place]) 11/08/2016   2 clots in lungs and left leg per pt  . Type II or unspecified type diabetes mellitus without mention of complication, uncontrolled 2008   completed DSME     Past Surgical History:  Procedure Laterality Date  . COLONOSCOPY WITH ESOPHAGOGASTRODUODENOSCOPY (EGD)  2013   normal-found scar tissue and removed (Dr. [name])    Social History   Socioeconomic History  . Marital status: Single    Spouse name: Not on file  . Number of children: 2  . Years of education: 62  . Highest education level: Not on file  Social Needs  . Financial resource strain: Not on file  . Food insecurity - worry: Not on file  . Food insecurity - inability: Not on file  . Transportation needs - medical: Not on file  . Transportation needs - non-medical: Not on file  Occupational History  . Occupation: Nutritional therapist   Tobacco Use  . Smoking status: Never Smoker  . Smokeless tobacco: Never Used  Substance and Sexual Activity  . Alcohol use: No  . Drug use: No  . Sexual activity: Yes  Other Topics Concern  . Not on file  Social History Narrative   Caffeine: occasional   Lives with GF and step daughter, 3 dogs, 1 cat   divorced   Occupation: road Futures trader    Edu: Secretary/administrator   Activity: work   Diet: good water, fruits/vegetables daily, avoids fried foods and starches    Family History  Problem Relation Age of Onset  . Cancer Unknown        unsure details - mother adopted, no contact with father  . Diabetes Mother   . Hypertension Mother   . Heart failure Mother        CHF  . Diabetes Father   . Hypertension Father   . CAD Father        MI  . CAD Brother 42       MI x4  . Stroke Neg Hx      Current  Outpatient Medications:  .  acetaminophen (TYLENOL) 325 MG tablet, Take 650 mg by mouth every 6 (six) hours as needed., Disp: , Rfl:  .  apixaban (ELIQUIS) 5 MG TABS tablet, Take 1 tablet (5 mg total) by mouth 2 (two) times daily. (Patient taking differently: Take 10 mg by mouth 2 (two) times daily. ), Disp: 60 tablet, Rfl: 3 .  Blood Glucose Monitoring Suppl (ONE TOUCH ULTRA SYSTEM KIT) W/DEVICE KIT, 1 kit by Does not apply route once., Disp: 1 each, Rfl: 0 .  empagliflozin (JARDIANCE) 25 MG TABS tablet, Take 25 mg by mouth daily., Disp: 90 tablet, Rfl: 1 .  gabapentin (NEURONTIN) 300 MG capsule, Take 1 capsule (300 mg total) by mouth 3 (three) times daily., Disp: 90 capsule, Rfl: 3 .  glucose blood test strip, Use as instructed to check sugar once daily or as needed. Dx: E11.40, Disp: 100 each, Rfl: 3 .  Lancets (ONETOUCH ULTRASOFT) lancets, Use as instructed to check sugar once daily or as needed. Dx: E11.40, Disp: 100 each, Rfl: 3 .  metFORMIN (GLUCOPHAGE) 1000 MG tablet, TAKE 1 TABLET  WITH MEALS TWICE A DAY FOR 90 DAYS, Disp: 180 tablet, Rfl: 0 .  Oxycodone HCl 10 MG TABS, Take 1 tablet (10 mg total) by mouth every 4 (four) hours as needed., Disp: 15 tablet, Rfl: 0 .  sulfamethoxazole-trimethoprim (BACTRIM DS,SEPTRA DS) 800-160 MG tablet, Take 1 tablet by mouth every 12 (twelve) hours., Disp: 14 tablet, Rfl: 0  Physical exam:  Vitals:   06/14/17 1532  Weight: 239 lb (108.4 kg)   Physical Exam  Constitutional: He is oriented to person, place, and time and well-developed, well-nourished, and in no distress.  HENT:  Head: Normocephalic and atraumatic.  Eyes: EOM are normal. Pupils are equal, round, and reactive to light.  Neck: Normal range of motion.  Cardiovascular: Normal rate, regular rhythm and normal heart sounds.  Pulmonary/Chest: Effort normal and breath sounds normal.  Abdominal: Soft. Bowel sounds are normal.  Neurological: He is alert and oriented to person, place, and time.    Skin: Skin is warm and dry.     CMP Latest Ref Rng & Units 04/23/2017  Glucose 65 - 99 mg/dL 168(H)  BUN 6 - 20 mg/dL 12  Creatinine 0.61 - 1.24 mg/dL 0.71  Sodium 135 - 145 mmol/L 137  Potassium 3.5 - 5.1 mmol/L 4.0  Chloride 101 - 111 mmol/L 103  CO2 22 - 32 mmol/L 28  Calcium 8.9 - 10.3 mg/dL 9.0  Total Protein 6.5 - 8.1 g/dL -  Total Bilirubin 0.3 - 1.2 mg/dL -  Alkaline Phos 38 - 126 U/L -  AST 15 - 41 U/L -  ALT 17 - 63 U/L -   CBC Latest Ref Rng & Units 05/09/2017  WBC 4.0 - 10.5 K/uL 11.0(H)  Hemoglobin 13.0 - 17.0 g/dL 15.3  Hematocrit 39.0 - 52.0 % 46.3  Platelets 150.0 - 400.0 K/uL 263.0     Assessment and plan- 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  I discussed hypercoagulable workup such as genetic testing and testing for antiphospholipid antibody syndrome.  Genetic testing would not change management for him but he does have 2 daughters and if he were to be tested positive for genetic mutations, his daughters may need to consider testing if they were to think about oral contraception in the future.  Patient would however like to hold off on any genetic testing at this time.  He said he would pass on this information to his daughters.  At this time patient can continue to follow-up with his primary care doctor Dr. Lacinda Axon who can manage his long-term anticoagulation.  He can be referred to [place] in the future if there are any questions or concerns   Visit Diagnosis 1. Acute saddle pulmonary embolism without acute cor pulmonale (HCC)      Dr. Randa Evens, MD, MPH [place] [facility] Pager- [phone] 06/14/2017

## 2017-06-14 NOTE — Progress Notes (Signed)
Patient here for follow up today. He reports having a bug bite a few weeks ago that got infected, and he just finished a round of antibiotics. The area is still sore but is healing.

## 2017-06-17 ENCOUNTER — Encounter: Payer: Self-pay | Admitting: Oncology

## 2017-06-19 NOTE — Telephone Encounter (Signed)
Mail letter.  Please contact us if we need to send any information to home care.  ( include prior note as well)

## 2017-06-19 NOTE — Telephone Encounter (Signed)
Letter has been sent

## 2017-06-30 ENCOUNTER — Other Ambulatory Visit: Payer: Self-pay | Admitting: Family Medicine

## 2017-07-01 NOTE — Telephone Encounter (Signed)
Left voice mail to call back, HIPPA complaint to confirm . Needs to establish with new PCP, then can refill meds.

## 2017-07-05 NOTE — Telephone Encounter (Signed)
Left message for patient to return cal to  Office patient needs to establish with new provider for further refill. P[facility] may schedule and notify office to fill medication. 

## 2017-07-19 ENCOUNTER — Other Ambulatory Visit: Payer: Self-pay | Admitting: Family Medicine

## 2017-08-03 ENCOUNTER — Other Ambulatory Visit: Payer: Self-pay | Admitting: Family

## 2017-08-12 ENCOUNTER — Encounter: Payer: Self-pay | Admitting: Family Medicine

## 2017-08-12 ENCOUNTER — Ambulatory Visit: Payer: BLUE CROSS/BLUE SHIELD | Admitting: Family Medicine

## 2017-08-12 VITALS — BP 140/78 | HR 105 | Temp 98.2°F | Resp 20 | Wt 237.1 lb

## 2017-08-12 DIAGNOSIS — J209 Acute bronchitis, unspecified: Secondary | ICD-10-CM

## 2017-08-12 DIAGNOSIS — I2692 Saddle embolus of pulmonary artery without acute cor pulmonale: Secondary | ICD-10-CM

## 2017-08-12 MED ORDER — HYDROCODONE-HOMATROPINE 5-1.5 MG/5ML PO SYRP: 5.0000 mL | ORAL_SOLUTION | Freq: Three times a day (TID) | ORAL | 0 refills | Status: AC | PRN

## 2017-08-12 MED ORDER — AZITHROMYCIN 250 MG PO TABS: ORAL_TABLET | ORAL | 0 refills | Status: AC

## 2017-08-12 NOTE — Progress Notes (Signed)
Patient ID: Paul Keller, male   DOB: 08-03-1971, 47 y.o.   MRN: [id number]  PCP: Burnard Hawthorne, FNP  Subjective:  Paul Keller is a 47 y.o. year old very pleasant male patient who presents with Upper Respiratory infection symptoms including nasal congestion,rhinitis, sneezing, post nasal drip, and cough that is nonproductive but is keeping him up at night. Associated fever at home. He reports that Tmax was 103 F 3 days ago. He denies fever over the past 2 days stating that he feels better. -started: 2 weeks ago, symptoms of sneezing, rhinitis are improving, cough is not improving  -previous treatments: Robitussin DM, Delsym, and acetaminophen have provided limited benefit.  -sick contacts/travel/risks: denies flu exposure. Influenza vaccine is not up to date -Hx of: Pulmonary embolism and is on lifelong anticoagulation (Eliquis) He has significant stress as his wife has terminal cancer and has stopped treatments at this time. He is seeking care today as he is concerned about being around her with his cough.  ROS-denies, SOB, NVD, tooth pain  Pertinent Past Medical History- acute saddle pulmonary embolism without acute cor pulmonale and lifelong coagulation, T2DM with neuropathy.  He is not a not a smoker No recent antibiotic use.  Medications- reviewed  Current Outpatient Medications  Medication Sig Dispense Refill  . acetaminophen (TYLENOL) 325 MG tablet Take 650 mg by mouth every 6 (six) hours as needed.    . Blood Glucose Monitoring Suppl (ONE TOUCH ULTRA SYSTEM KIT) W/DEVICE KIT 1 kit by Does not apply route once. 1 each 0  . ELIQUIS 5 MG TABS tablet TAKE 1 TABLET BY MOUTH TWICE A DAY 60 tablet 3  . empagliflozin (JARDIANCE) 25 MG TABS tablet Take 25 mg by mouth daily. 90 tablet 1  . gabapentin (NEURONTIN) 300 MG capsule Take 1 capsule (300 mg total) by mouth 3 (three) times daily. 90 capsule 3  . glucose blood test strip Use as instructed to check sugar once daily or as  needed. Dx: E11.40 100 each 3  . JARDIANCE 10 MG TABS tablet TAKE 1 TABLET BY MOUTH EVERY DAY 90 tablet 1  . Lancets (ONETOUCH ULTRASOFT) lancets Use as instructed to check sugar once daily or as needed. Dx: E11.40 100 each 3  . metFORMIN (GLUCOPHAGE) 1000 MG tablet TAKE 1 TABLET WITH MEALS TWICE A DAY FOR 90 DAYS 180 tablet 0  . Oxycodone HCl 10 MG TABS Take 1 tablet (10 mg total) by mouth every 4 (four) hours as needed. (Patient not taking: Reported on 08/12/2017) 15 tablet 0   No current facility-administered medications for this visit.     Objective: BP 140/78 (BP Location: Left Arm, Patient Position: Sitting, Cuff Size: Normal)   Pulse (!) 105   Temp 98.2 F (36.8 C) (Oral)   Resp 20   Wt 237 lb 2 oz (107.6 kg)   SpO2 98%   BMI 32.16 kg/m  Gen: NAD, resting comfortably HEENT: Turbinates erythematous, TM normal, pharynx mildly erythematous with no tonsilar exudate or edema, no sinus tenderness CV: RRR no murmurs rubs or gallops Lungs: CTAB no crackles, wheeze, rhonchi Abdomen: soft/nontender/nondistended/normal bowel sounds. No rebound or guarding.  Ext: no edema Skin: warm, dry, no rash Neuro: grossly normal, moves all extremities  Assessment/Plan:  1. Acute bronchitis, unspecified organism Symptoms most consistent with bronchitis. Exam is reassuring; lung exam is benign; O2 sats 98%. We reviewed that this is likely viral in nature as cough can persist for 2 to 3 weeks. We further discussed use  of antibiotics and symptomatic treatment. As lung exam is benign and history of T2DM, prednisone taper was not initiated as patient stated that cough is most bothersome at night. Hycodan provided for cough relief. Provided a prescription of azithromycin to use with history of fever noted of Tmax 103, history of T2DM, and if not improvement is noted in 2 days. Further advised increasing fluids, rest, and tylenol if needed. Follow up precautions provided.   2. Acute saddle pulmonary  embolism without acute cor pulmonale ([place]) Follow up with hematology/oncology provider as scheduled. Tylenol advised as patient is on lifelong anticoagulation.   Finally, we reviewed reasons to return to care including if symptoms worsen or persist or new concerns arise- once again particularly shortness of breath or fever.   Laurita Quint, FNP

## 2017-08-12 NOTE — Patient Instructions (Addendum)
It was great seeing you today.  I have sent in a prescription for azithromycin (antibiotic) and hycodan for your cough since your symptoms have not improved and lasted for 2 weeks.  Use the cough syrup at night.   During the day, Delsym and/or Mucinex is best.      Acute Bronchitis Bronchitis is inflammation of the airways that extend from the windpipe into the lungs (bronchi). The inflammation often causes mucus to develop. This leads to a cough, which is the most common symptom of bronchitis.  In acute bronchitis, the condition usually develops suddenly and goes away over time, usually in a couple weeks. Smoking, allergies, and asthma can make bronchitis worse. Repeated episodes of bronchitis may cause further lung problems.  CAUSES Acute bronchitis is most often caused by the same virus that causes a cold. The virus can spread from person to person (contagious) through coughing, sneezing, and touching contaminated objects. SIGNS AND SYMPTOMS   Cough.   Fever.   Coughing up mucus.   Body aches.   Chest congestion.   Chills.   Shortness of breath.   Sore throat.  DIAGNOSIS  Acute bronchitis is usually diagnosed through a physical exam. Your health care provider will also ask you questions about your medical history. Tests, such as chest X-rays, are sometimes done to rule out other conditions.  TREATMENT  Acute bronchitis usually goes away in a couple weeks. Oftentimes, no medical treatment is necessary. Medicines are sometimes given for relief of fever or cough. Antibiotic medicines are usually not needed but may be prescribed in certain situations. In some cases, an inhaler may be recommended to help reduce shortness of breath and control the cough. A cool mist vaporizer may also be used to help thin bronchial secretions and make it easier to clear the chest.  HOME CARE INSTRUCTIONS  Get plenty of rest.   Drink enough fluids to keep your urine clear or pale yellow  (unless you have a medical condition that requires fluid restriction). Increasing fluids may help thin your respiratory secretions (sputum) and reduce chest congestion, and it will prevent dehydration.   Take medicines only as directed by your health care provider.  If you were prescribed an antibiotic medicine, finish it all even if you start to feel better.  Avoid smoking and secondhand smoke. Exposure to cigarette smoke or irritating chemicals will make bronchitis worse. If you are a smoker, consider using nicotine gum or skin patches to help control withdrawal symptoms. Quitting smoking will help your lungs heal faster.   Reduce the chances of another bout of acute bronchitis by washing your hands frequently, avoiding people with cold symptoms, and trying not to touch your hands to your mouth, nose, or eyes.   Keep all follow-up visits as directed by your health care provider.  SEEK MEDICAL CARE IF: Your symptoms do not improve after 1 week of treatment.  SEEK IMMEDIATE MEDICAL CARE IF:  You develop an increased fever or chills.   You have chest pain.   You have severe shortness of breath.  You have bloody sputum.   You develop dehydration.  You faint or repeatedly feel like you are going to pass out.  You develop repeated vomiting.  You develop a severe headache. MAKE SURE YOU:   Understand these instructions.  Will watch your condition.  Will get help right away if you are not doing well or get worse.   This information is not intended to replace advice given to you by  your health care provider. Make sure you discuss any questions you have with your health care provider.   Document Released: 08/30/2004 Document Revised: 08/13/2014 Document Reviewed: 01/13/2013 Elsevier Interactive Patient Education [employer].

## 2017-08-12 NOTE — Progress Notes (Signed)
Pre-visit discussion using our clinic review tool. No additional management support is needed unless otherwise documented below in the visit note.  

## 2017-08-13 ENCOUNTER — Encounter: Payer: Self-pay | Admitting: Family Medicine

## 2017-08-26 ENCOUNTER — Other Ambulatory Visit: Payer: Self-pay | Admitting: Family Medicine

## 2017-09-03 ENCOUNTER — Other Ambulatory Visit: Payer: Self-pay

## 2017-09-29 ENCOUNTER — Telehealth: Payer: Self-pay | Admitting: Family

## 2017-10-02 NOTE — Telephone Encounter (Signed)
close

## 2017-10-26 ENCOUNTER — Other Ambulatory Visit: Payer: Self-pay | Admitting: Family

## 2017-10-27 ENCOUNTER — Other Ambulatory Visit: Payer: Self-pay | Admitting: Family

## 2017-10-28 ENCOUNTER — Other Ambulatory Visit: Payer: Self-pay

## 2017-10-28 NOTE — Telephone Encounter (Signed)
Entered in error

## 2017-11-02 ENCOUNTER — Other Ambulatory Visit: Payer: Self-pay | Admitting: Family

## 2017-12-26 ENCOUNTER — Other Ambulatory Visit: Payer: Self-pay | Admitting: Family

## 2017-12-27 ENCOUNTER — Other Ambulatory Visit: Payer: Self-pay | Admitting: Family

## 2017-12-27 NOTE — Telephone Encounter (Signed)
Last office visit 05/20/17, to follow up 3 months

## 2017-12-27 NOTE — Telephone Encounter (Signed)
Call pt  Refilled for 3 months - no more refills until he is seen  He is over due for DM f/u

## 2018-01-01 NOTE — Telephone Encounter (Signed)
Last office visit 08/12/17 Paul Keller , can you look at note referred to hematology oncology  You saw patient 05/20/17 No office visit

## 2018-01-02 NOTE — Telephone Encounter (Signed)
Left voice mail for patient to call back ok for P[facility] to speak to patient    

## 2018-01-02 NOTE — Telephone Encounter (Signed)
Please call pt  I refilled eliquis  However at some pt,  he would need labs to check for renal function.  I know he a lot on his plate with his wife and let him know that I recognize that. When he can, please make a follow up appt with [place] as he is also overdue to his A1c.

## 2018-01-10 NOTE — Telephone Encounter (Signed)
Spoke to patient he has now relocated to [place] 

## 2018-01-10 NOTE — Telephone Encounter (Signed)
Spoke to patient he has now relocated to [place]  

## 2018-01-23 ENCOUNTER — Telehealth: Payer: Self-pay | Admitting: Internal Medicine

## 2018-01-23 ENCOUNTER — Encounter: Payer: Self-pay | Admitting: Internal Medicine

## 2018-01-23 NOTE — Telephone Encounter (Signed)
3 attempts to schedule fu appt from recall list.   Deleting recall.  °Mailed Letter  °

## 2018-02-04 ENCOUNTER — Other Ambulatory Visit: Payer: Self-pay | Admitting: Family

## 2018-03-03 ENCOUNTER — Other Ambulatory Visit: Payer: Self-pay

## 2018-03-03 ENCOUNTER — Telehealth: Payer: Self-pay | Admitting: Family

## 2018-03-03 MED ORDER — EMPAGLIFLOZIN 10 MG PO TABS: 10.0000 mg | ORAL_TABLET | Freq: Every day | ORAL | 0 refills | Status: DC

## 2018-03-03 MED ORDER — APIXABAN 5 MG PO TABS: 5.0000 mg | ORAL_TABLET | Freq: Two times a day (BID) | ORAL | 1 refills | Status: DC

## 2018-03-03 NOTE — Telephone Encounter (Signed)
Copied from CRM [id number]. Topic: Quick Communication - See Telephone Encounter >> Mar 03, 2018  8:38 AM Jolayne Hainesaylor, Brittany L wrote: CRM for notification. See Telephone encounter for: 03/03/18.  [facility] [place] [facility], patient just moved there and is needing new scripts.  ELIQUIS 5 MG TABS tablet JARDIANCE 10 MG TABS tablet  Insurance requires 90 day supply.  [id number] [place] [place] - [address] [place] [id number] Phone: [phone] Fax: [phone]

## 2018-03-03 NOTE — Telephone Encounter (Signed)
rx refilled.

## 2018-03-03 NOTE — Telephone Encounter (Signed)
Eliquis and Jardiance refill. Pt has just moved to [place] and needs new scripts.Insurance requires a 90 day supply.  Last OV10/15/19   PCP:  Rennie PlowmanMargaret Arnett [facility] [place] [address]

## 2018-03-04 ENCOUNTER — Telehealth: Payer: Self-pay | Admitting: Family

## 2018-03-04 NOTE — Telephone Encounter (Signed)
Patient taking Eliquis 5 mg BID ok for clearance for tooth extraction?

## 2018-03-04 NOTE — Telephone Encounter (Signed)
Copied from CRM [id number]. Topic: Quick Communication - See Telephone Encounter >> Mar 04, 2018 12:40 PM Burchel, Abbi R wrote: CRM for notification. See Telephone encounter for: 03/04/18.  Pt states that Rennie PlowmanMargaret Arnett prescribed his blood thinner.  He is requesting medical clearance [facility] have extraction done on 03/07/18 at 3:30.  Please call pt if he is not approved.    Pt: [phone]   Fax: [phone]

## 2018-03-05 ENCOUNTER — Telehealth: Payer: Self-pay

## 2018-03-05 NOTE — Telephone Encounter (Signed)
Copied from CRM [id number]. Topic: Quick Communication - See Telephone Encounter >> Mar 04, 2018 12:40 PM Burchel, Abbi R wrote: CRM for notification. See Telephone encounter for: 03/04/18.  Pt states that Rennie PlowmanMargaret Arnett prescribed his blood thinner.  He is requesting medical clearance [facility] to have extraction done on 03/07/18 at 3:30.  Please call pt if he is not approved.    Pt: [phone]   Fax: [phone] >> Mar 05, 2018 10:07 AM Gloriann LoanPayne, Angela L wrote: Pt calling back about a clearance form sent to the dentist and want to know if it was done  medical clearance [facility] to have extraction done on 03/07/18 at 3:30. Please call pt if he is not approved.       Pt: [phone]     Fax: [phone]

## 2018-03-06 ENCOUNTER — Encounter: Payer: Self-pay | Admitting: Family

## 2018-03-06 MED ORDER — EMPAGLIFLOZIN 10 MG PO TABS: 10.0000 mg | ORAL_TABLET | Freq: Every day | ORAL | 1 refills | Status: DC

## 2018-03-06 NOTE — Telephone Encounter (Addendum)
Letter printed for your signature Placed in your folder rx sent

## 2018-03-06 NOTE — Telephone Encounter (Signed)
Spoke with patient, advised you were out of office today. See previous phone encounter from 03/04/18. Appointment 03/07/18 3:30pm.

## 2018-03-06 NOTE — Addendum Note (Signed)
Addended by: Alisia FerrariBARE, Glenis Musolf C on: 03/06/2018 11:57 AM   Modules accepted: Orders

## 2018-03-06 NOTE — Telephone Encounter (Signed)
Spoke with patient after speaking with Dr Smith Robertao . Patient took last dose of elliquis on Tuesday . He is holding 2 days prior to surgery and also on Friday ( day of one tooth extraction) Fax to dentist: Please fax to dental office that patient is able to proceed with extraction. He has held anticoagulation for 2 days as directed.  Also, patient needs refill of jardiance. He is on 25mg  qd. Please send in 6 month supply .

## 2018-03-07 NOTE — Telephone Encounter (Signed)
Letter faxed to Dr Smith Robertao

## 2018-03-07 NOTE — Telephone Encounter (Signed)
Duplicate This has been done and faxed to dentist this morning for approval for extraction.  I have spoken to patient and Dr Smith Robertao yesterday.

## 2018-04-04 ENCOUNTER — Encounter: Payer: Self-pay | Admitting: Family

## 2018-04-04 ENCOUNTER — Telehealth: Payer: Self-pay | Admitting: Family

## 2018-04-04 NOTE — Telephone Encounter (Signed)
jardiance 25 mg tab refill (pt looking for this dosage) per chart, looks like it was discontinued on 12/27/17 Last Refill:03/06/18 for jardiance 10 mg tab  # 90   Last OV: 08/12/17 PCP: Rennie PlowmanMargaret Keller Pharmacy:[facility] [phone] Last HA1C was 05/20/17

## 2018-04-04 NOTE — Telephone Encounter (Signed)
Copied from [phone]. Topic: Quick Communication - Rx Refill/Question >> Apr 04, 2018  2:14 PM Arlyss Gandyichardson, Draedyn Weidinger N, NT wrote: Medication: empagliflozin (JARDIANCE) 25 MG TABS tablet  Patient states last month Rennie PlowmanMargaret Arnett increased his Jardiance from 10mg  to 25mg  but the 25mg  was never sent to the pharmacy.   Has the patient contacted their pharmacy? Yes.   (Agent: If no, request that the patient contact the pharmacy for the refill.) (Agent: If yes, when and what did the pharmacy advise?)  Preferred Pharmacy (with phone number or street name): [id number] - [place], [place] - [address] [phone] (Phone) [phone] (Fax)    Agent: Please be advised that RX refills may take up to 3 business days. We ask that you follow-up with your pharmacy.

## 2018-04-09 ENCOUNTER — Other Ambulatory Visit: Payer: Self-pay | Admitting: Family

## 2018-04-09 DIAGNOSIS — IMO0002 Reserved for concepts with insufficient information to code with codable children: Secondary | ICD-10-CM

## 2018-04-09 DIAGNOSIS — E1140 Type 2 diabetes mellitus with diabetic neuropathy, unspecified: Secondary | ICD-10-CM

## 2018-04-09 DIAGNOSIS — E1165 Type 2 diabetes mellitus with hyperglycemia: Principal | ICD-10-CM

## 2018-04-09 MED ORDER — EMPAGLIFLOZIN 25 MG PO TABS: 25.0000 mg | ORAL_TABLET | Freq: Every day | ORAL | 0 refills | Status: AC

## 2018-04-09 NOTE — Progress Notes (Signed)
close

## 2018-05-26 ENCOUNTER — Other Ambulatory Visit: Payer: Self-pay | Admitting: Family

## 2018-05-26 NOTE — Telephone Encounter (Signed)
Left voice mail for patient to call back ok for [facility] to speak to patient , patient needs to schedule follow up appointment to see Claris Che, NP

## 2018-05-30 NOTE — Telephone Encounter (Signed)
Mychart message sent to schedule appointment

## 2018-06-28 ENCOUNTER — Other Ambulatory Visit: Payer: Self-pay | Admitting: Family

## 2018-07-02 IMAGING — DX DG ELBOW COMPLETE 3+V*R*
4 series · 4 of 4 positions shown · non-contrast
Comparison: None.

CLINICAL DATA: Infection/swelling. Pain and redness in the right
elbow since [REDACTED], progressing.

EXAM:
RIGHT ELBOW - COMPLETE 3+ VIEW

[elbow ap]
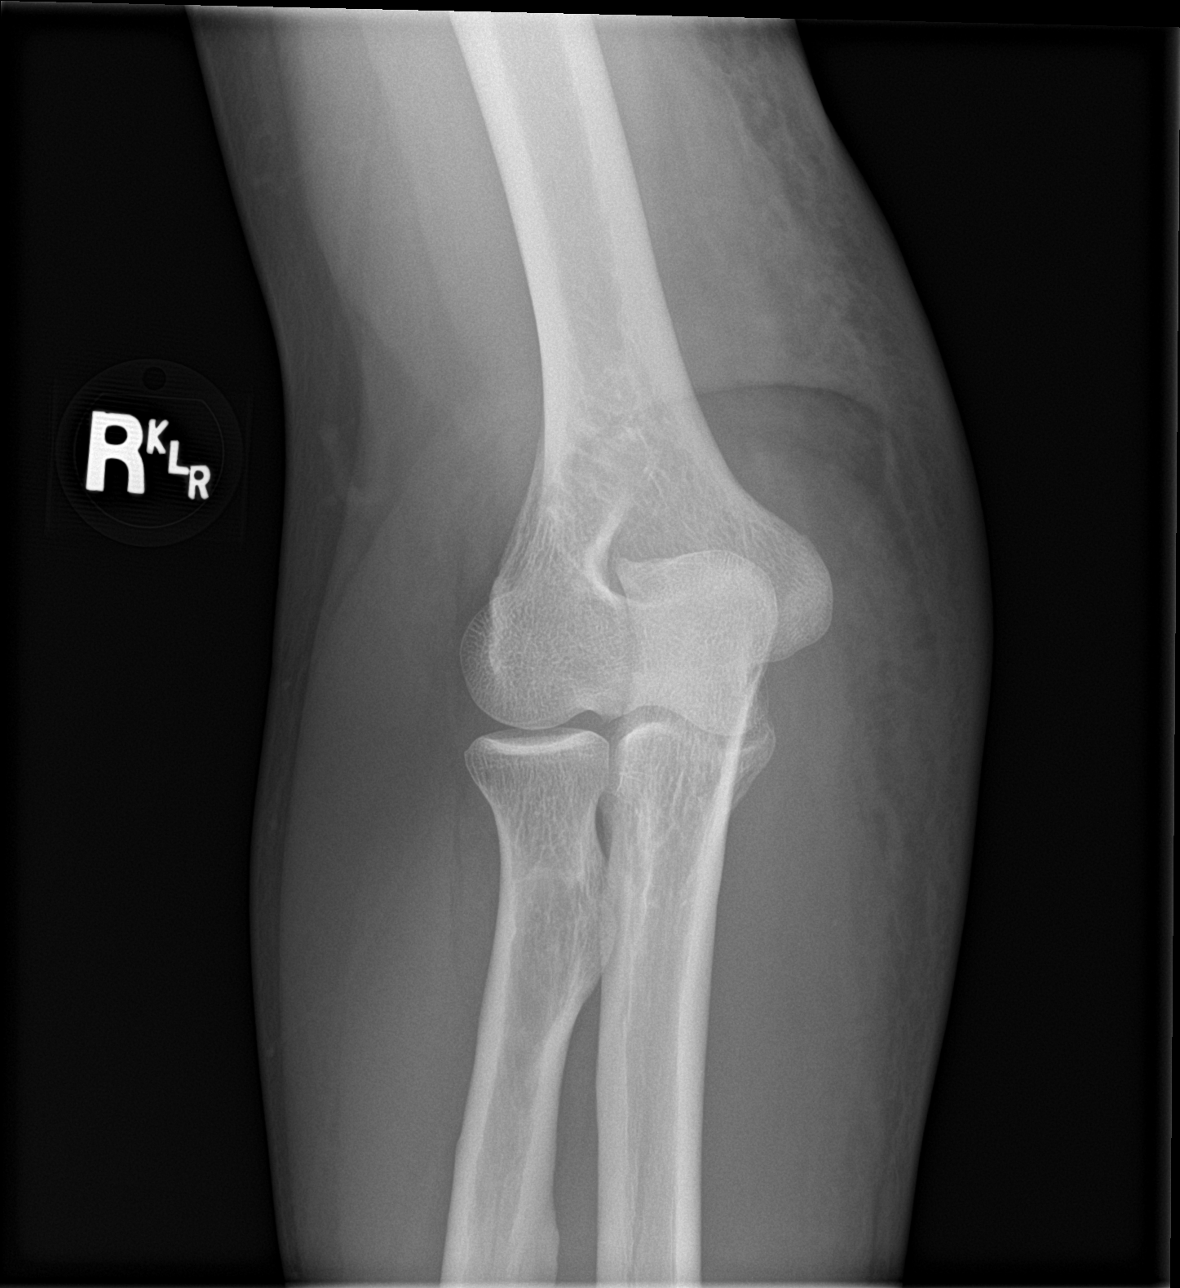

[elbow obl (1 of 2)]
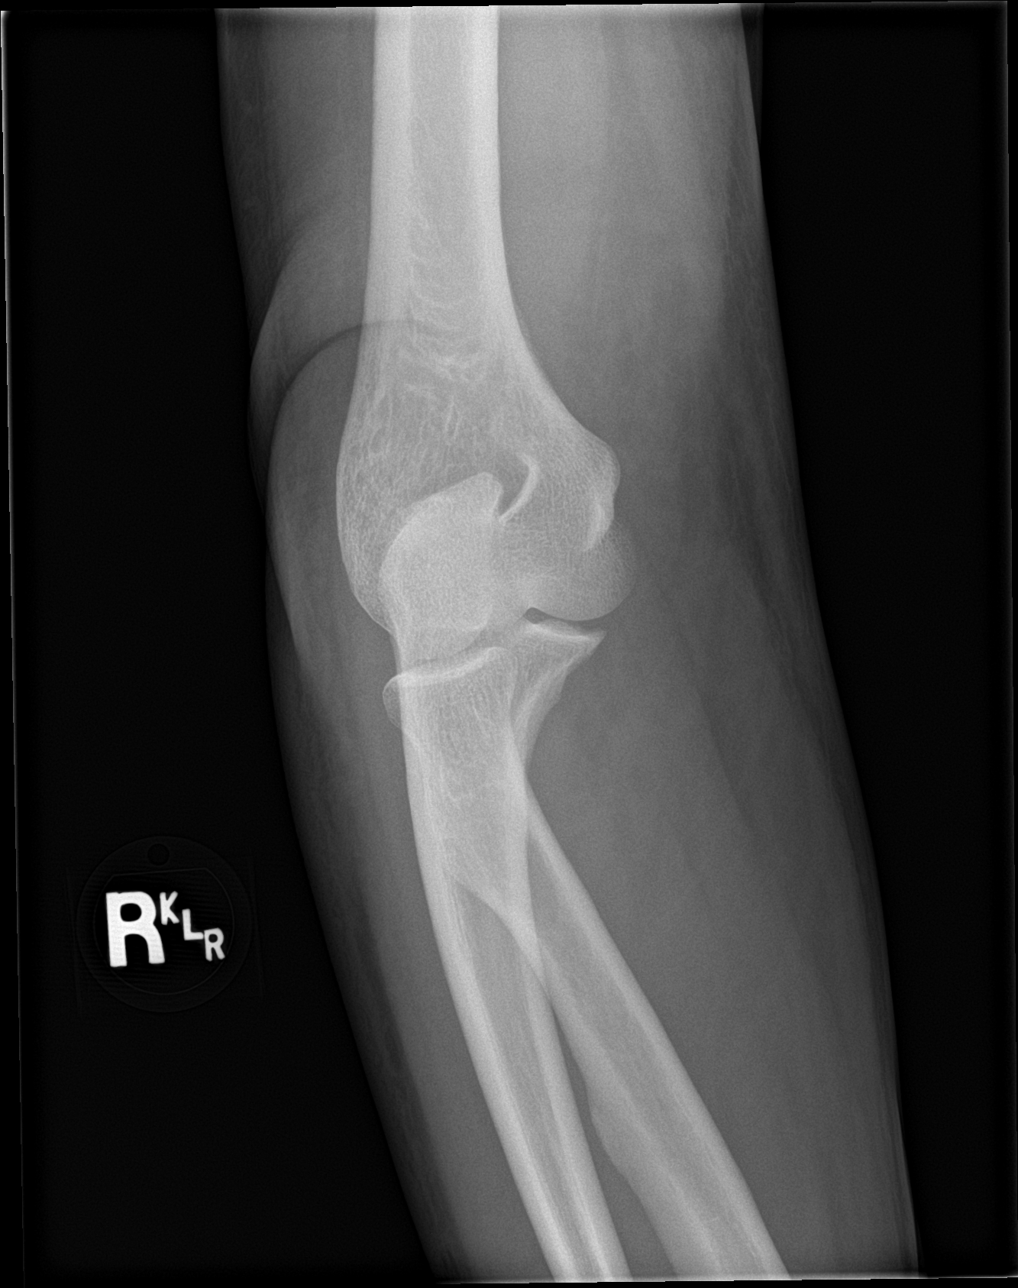

[elbow obl (2 of 2)]
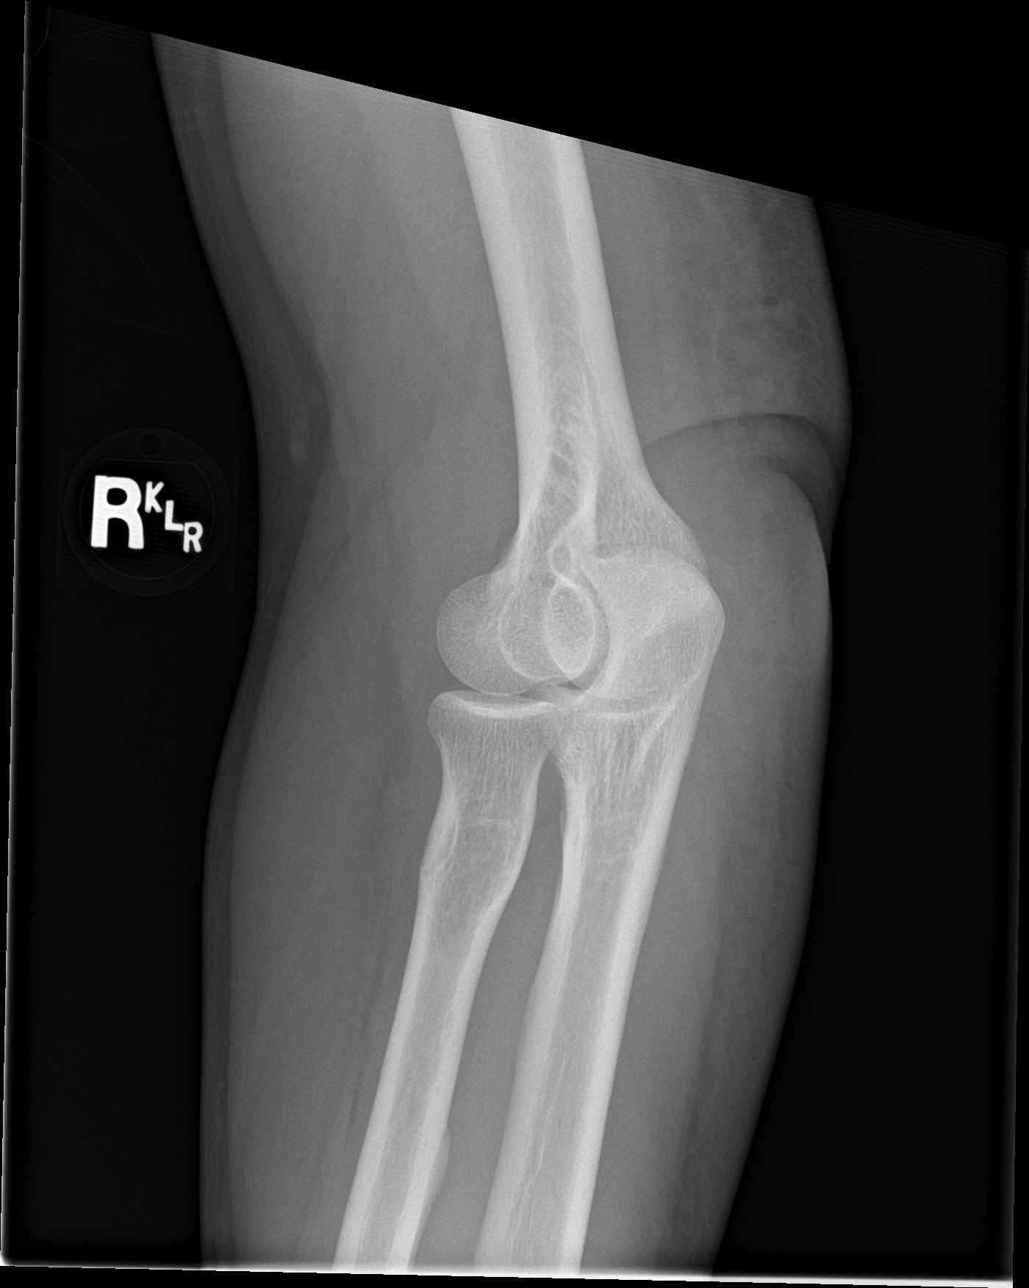

[elbow lat]
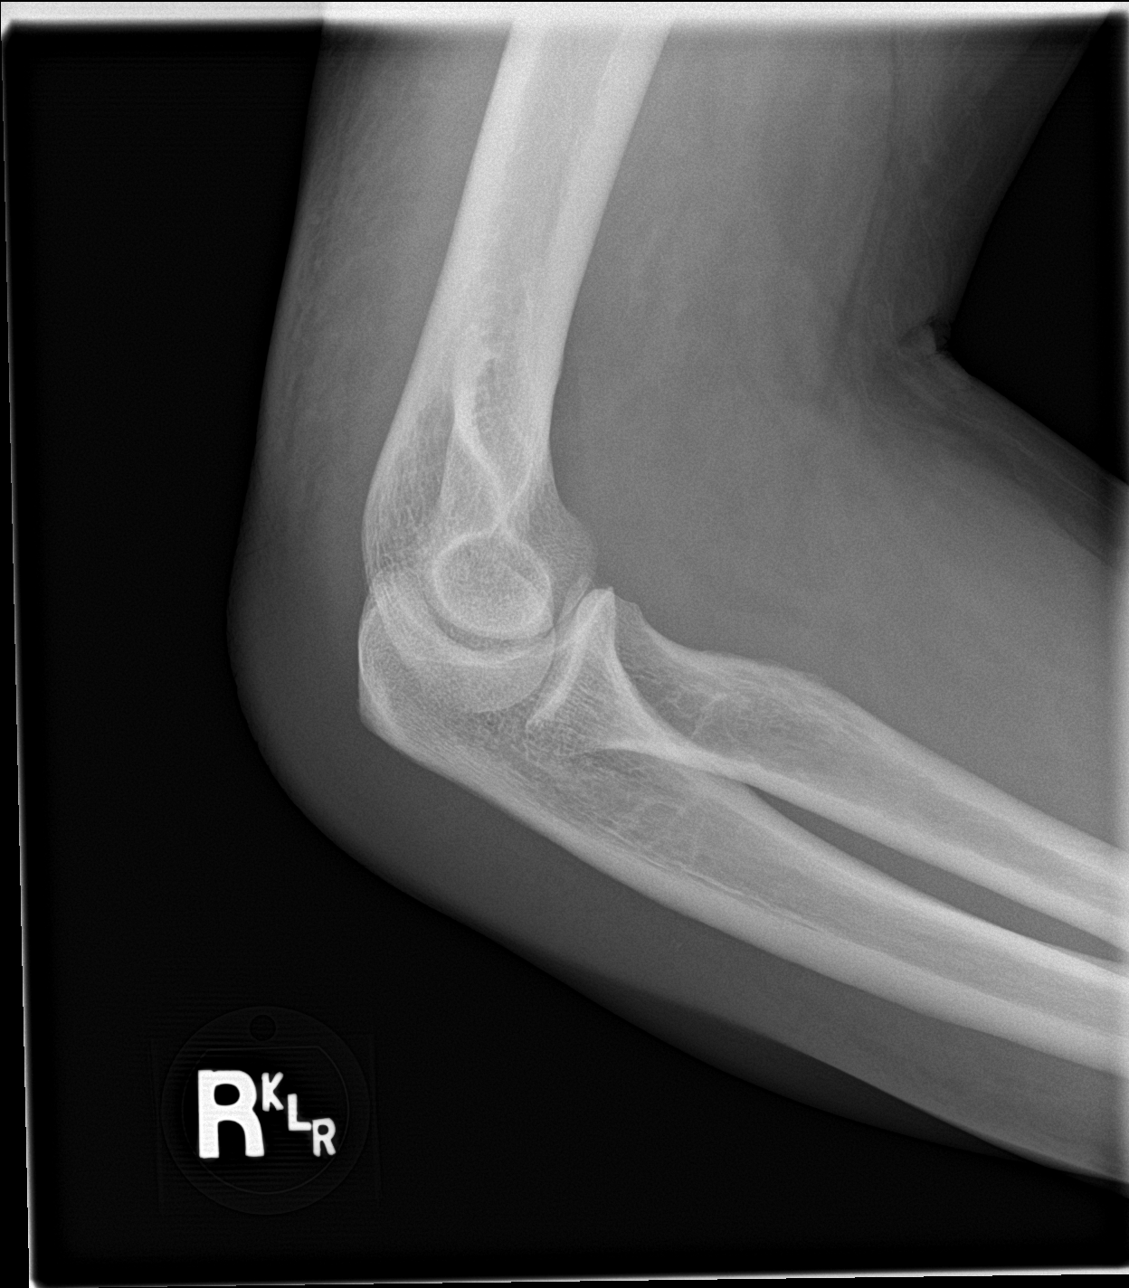

[4 of 4 positions shown; findings below may reference images not displayed]

FINDINGS: Prominent soft tissue swelling about the medial and posterior elbow.
No fracture or erosion. Negative for joint effusion. No soft tissue
emphysema.
IMPRESSION: Soft tissue swelling in keeping with history of skin infection. No
soft tissue emphysema or opaque foreign body.

No osseous findings.

## 2018-09-23 ENCOUNTER — Other Ambulatory Visit: Payer: Self-pay | Admitting: Family

## 2018-11-06 ENCOUNTER — Telehealth: Payer: Self-pay | Admitting: Family Medicine

## 2018-11-06 NOTE — Telephone Encounter (Signed)
Please call to set up appt  Could do lab visit now, web ex now or future in office visit with provider  Due for:  A1c and other fasting labs  Urine Microalbumin  Opthalmology exam for diabetics  Foot exam

## 2018-11-07 NOTE — Telephone Encounter (Signed)
I called patient, but was unable to leave message since VM was full.

## 2020-06-06 DEATH — deceased
